# Patient Record
Sex: Male | Born: 1945 | Race: Black or African American | Hispanic: No | Marital: Married | Smoking: Never smoker
Health system: Southern US, Community
[De-identification: ages and names within clinical notes are randomized; demographics above are authoritative.]

## PROBLEM LIST (undated history)

## (undated) DIAGNOSIS — R011 Cardiac murmur, unspecified: Secondary | ICD-10-CM

## (undated) DIAGNOSIS — E785 Hyperlipidemia, unspecified: Secondary | ICD-10-CM

## (undated) DIAGNOSIS — C61 Malignant neoplasm of prostate: Secondary | ICD-10-CM

## (undated) HISTORY — DX: Hyperlipidemia, unspecified: E78.5

## (undated) HISTORY — PX: PROSTATE BIOPSY: SHX241

---

## 1991-05-16 HISTORY — PX: HERNIA REPAIR: SHX51

## 2010-06-30 ENCOUNTER — Emergency Department (INDEPENDENT_AMBULATORY_CARE_PROVIDER_SITE_OTHER): Payer: BC Managed Care – PPO

## 2010-06-30 ENCOUNTER — Emergency Department (HOSPITAL_BASED_OUTPATIENT_CLINIC_OR_DEPARTMENT_OTHER)
Admission: EM | Admit: 2010-06-30 | Discharge: 2010-06-30 | Disposition: A | Discharge status: Home / Self Care | Payer: BC Managed Care – PPO | Attending: Emergency Medicine | Admitting: Emergency Medicine

## 2010-06-30 DIAGNOSIS — R059 Cough, unspecified: Secondary | ICD-10-CM

## 2010-06-30 DIAGNOSIS — E7800 Pure hypercholesterolemia, unspecified: Secondary | ICD-10-CM | POA: Insufficient documentation

## 2010-06-30 DIAGNOSIS — R05 Cough: Secondary | ICD-10-CM | POA: Insufficient documentation

## 2010-06-30 DIAGNOSIS — J40 Bronchitis, not specified as acute or chronic: Secondary | ICD-10-CM | POA: Insufficient documentation

## 2010-06-30 DIAGNOSIS — J45909 Unspecified asthma, uncomplicated: Secondary | ICD-10-CM | POA: Insufficient documentation

## 2010-06-30 DIAGNOSIS — R0989 Other specified symptoms and signs involving the circulatory and respiratory systems: Secondary | ICD-10-CM

## 2010-06-30 LAB — POCT CARDIAC MARKERS
CKMB, poc: 1.2 ng/mL (ref 1.0–8.0)
Myoglobin, poc: 216 ng/mL (ref 12–200)
Myoglobin, poc: 73.4 ng/mL (ref 12–200)
Troponin i, poc: <0.05 ng/mL (ref 0.00–0.09)

## 2011-12-16 ENCOUNTER — Emergency Department: Payer: Self-pay | Admitting: Emergency Medicine

## 2013-01-09 DIAGNOSIS — R079 Chest pain, unspecified: Secondary | ICD-10-CM | POA: Diagnosis not present

## 2013-01-09 DIAGNOSIS — Z23 Encounter for immunization: Secondary | ICD-10-CM | POA: Diagnosis not present

## 2013-01-09 DIAGNOSIS — G4700 Insomnia, unspecified: Secondary | ICD-10-CM | POA: Diagnosis not present

## 2013-01-09 DIAGNOSIS — R69 Illness, unspecified: Secondary | ICD-10-CM | POA: Diagnosis not present

## 2013-01-09 DIAGNOSIS — E785 Hyperlipidemia, unspecified: Secondary | ICD-10-CM | POA: Diagnosis not present

## 2013-01-27 DIAGNOSIS — R079 Chest pain, unspecified: Secondary | ICD-10-CM | POA: Diagnosis not present

## 2013-02-26 DIAGNOSIS — E785 Hyperlipidemia, unspecified: Secondary | ICD-10-CM | POA: Diagnosis not present

## 2013-03-26 DIAGNOSIS — N400 Enlarged prostate without lower urinary tract symptoms: Secondary | ICD-10-CM | POA: Diagnosis not present

## 2013-03-26 DIAGNOSIS — G4700 Insomnia, unspecified: Secondary | ICD-10-CM | POA: Diagnosis not present

## 2013-03-26 DIAGNOSIS — R69 Illness, unspecified: Secondary | ICD-10-CM | POA: Diagnosis not present

## 2013-03-26 DIAGNOSIS — Z0000 Encounter for general adult medical examination without abnormal findings: Secondary | ICD-10-CM | POA: Diagnosis not present

## 2013-03-26 DIAGNOSIS — E785 Hyperlipidemia, unspecified: Secondary | ICD-10-CM | POA: Diagnosis not present

## 2013-03-26 DIAGNOSIS — Z23 Encounter for immunization: Secondary | ICD-10-CM | POA: Diagnosis not present

## 2013-04-08 DIAGNOSIS — Z0000 Encounter for general adult medical examination without abnormal findings: Secondary | ICD-10-CM | POA: Diagnosis not present

## 2013-07-03 DIAGNOSIS — H524 Presbyopia: Secondary | ICD-10-CM | POA: Diagnosis not present

## 2013-08-04 DIAGNOSIS — E7800 Pure hypercholesterolemia, unspecified: Secondary | ICD-10-CM | POA: Diagnosis not present

## 2013-08-04 DIAGNOSIS — N400 Enlarged prostate without lower urinary tract symptoms: Secondary | ICD-10-CM | POA: Diagnosis not present

## 2013-08-04 DIAGNOSIS — M5380 Other specified dorsopathies, site unspecified: Secondary | ICD-10-CM | POA: Diagnosis not present

## 2013-10-02 DIAGNOSIS — N529 Male erectile dysfunction, unspecified: Secondary | ICD-10-CM | POA: Diagnosis not present

## 2013-10-02 DIAGNOSIS — N401 Enlarged prostate with lower urinary tract symptoms: Secondary | ICD-10-CM | POA: Diagnosis not present

## 2013-10-02 DIAGNOSIS — N139 Obstructive and reflux uropathy, unspecified: Secondary | ICD-10-CM | POA: Diagnosis not present

## 2013-10-24 DIAGNOSIS — B351 Tinea unguium: Secondary | ICD-10-CM | POA: Diagnosis not present

## 2013-10-24 DIAGNOSIS — L600 Ingrowing nail: Secondary | ICD-10-CM | POA: Diagnosis not present

## 2013-10-24 DIAGNOSIS — M79609 Pain in unspecified limb: Secondary | ICD-10-CM | POA: Diagnosis not present

## 2014-02-24 DIAGNOSIS — I4901 Ventricular fibrillation: Secondary | ICD-10-CM | POA: Diagnosis not present

## 2014-02-24 DIAGNOSIS — R251 Tremor, unspecified: Secondary | ICD-10-CM | POA: Diagnosis not present

## 2014-02-24 DIAGNOSIS — E785 Hyperlipidemia, unspecified: Secondary | ICD-10-CM | POA: Diagnosis not present

## 2014-02-24 DIAGNOSIS — Z23 Encounter for immunization: Secondary | ICD-10-CM | POA: Diagnosis not present

## 2014-02-24 DIAGNOSIS — R109 Unspecified abdominal pain: Secondary | ICD-10-CM | POA: Diagnosis not present

## 2014-02-24 DIAGNOSIS — N400 Enlarged prostate without lower urinary tract symptoms: Secondary | ICD-10-CM | POA: Diagnosis not present

## 2014-03-23 DIAGNOSIS — Z0000 Encounter for general adult medical examination without abnormal findings: Secondary | ICD-10-CM | POA: Diagnosis not present

## 2014-03-23 DIAGNOSIS — Z23 Encounter for immunization: Secondary | ICD-10-CM | POA: Diagnosis not present

## 2014-03-23 DIAGNOSIS — N400 Enlarged prostate without lower urinary tract symptoms: Secondary | ICD-10-CM | POA: Diagnosis not present

## 2014-03-23 DIAGNOSIS — E785 Hyperlipidemia, unspecified: Secondary | ICD-10-CM | POA: Diagnosis not present

## 2014-10-29 DIAGNOSIS — N5201 Erectile dysfunction due to arterial insufficiency: Secondary | ICD-10-CM | POA: Diagnosis not present

## 2014-10-29 DIAGNOSIS — R399 Unspecified symptoms and signs involving the genitourinary system: Secondary | ICD-10-CM | POA: Diagnosis not present

## 2014-10-29 DIAGNOSIS — N401 Enlarged prostate with lower urinary tract symptoms: Secondary | ICD-10-CM | POA: Diagnosis not present

## 2015-02-16 DIAGNOSIS — H2513 Age-related nuclear cataract, bilateral: Secondary | ICD-10-CM | POA: Diagnosis not present

## 2015-02-16 DIAGNOSIS — H25013 Cortical age-related cataract, bilateral: Secondary | ICD-10-CM | POA: Diagnosis not present

## 2015-02-16 DIAGNOSIS — H40023 Open angle with borderline findings, high risk, bilateral: Secondary | ICD-10-CM | POA: Diagnosis not present

## 2015-02-16 DIAGNOSIS — H524 Presbyopia: Secondary | ICD-10-CM | POA: Diagnosis not present

## 2015-03-20 ENCOUNTER — Emergency Department: Payer: Medicare Other

## 2015-03-20 ENCOUNTER — Encounter: Payer: Self-pay | Admitting: *Deleted

## 2015-03-20 ENCOUNTER — Emergency Department
Admission: EM | Admit: 2015-03-20 | Discharge: 2015-03-20 | Disposition: A | Discharge status: Home / Self Care | Payer: Medicare Other | Attending: Emergency Medicine | Admitting: Emergency Medicine

## 2015-03-20 DIAGNOSIS — R079 Chest pain, unspecified: Secondary | ICD-10-CM | POA: Diagnosis not present

## 2015-03-20 LAB — TROPONIN I

## 2015-03-20 LAB — BASIC METABOLIC PANEL
ANION GAP: 3 — AB (ref 5–15)
BUN: 19 mg/dL (ref 6–20)
CALCIUM: 9.3 mg/dL (ref 8.9–10.3)
CO2: 28 mmol/L (ref 22–32)
Chloride: 107 mmol/L (ref 101–111)
Creatinine, Ser: 1.09 mg/dL (ref 0.61–1.24)
GFR calc Af Amer: >60 mL/min (ref >60)
GFR calc non Af Amer: >60 mL/min (ref >60)
GLUCOSE: 109 mg/dL — AB (ref 65–99)
Potassium: 3.8 mmol/L (ref 3.5–5.1)
Sodium: 138 mmol/L (ref 135–145)

## 2015-03-20 LAB — CBC
HCT: 44.8 % (ref 40.0–52.0)
HEMOGLOBIN: 15 g/dL (ref 13.0–18.0)
MCH: 31.1 pg (ref 26.0–34.0)
MCHC: 33.6 g/dL (ref 32.0–36.0)
MCV: 92.7 fL (ref 80.0–100.0)
Platelets: 232 10*3/uL (ref 150–440)
RBC: 4.84 MIL/uL (ref 4.40–5.90)
RDW: 13 % (ref 11.5–14.5)
WBC: 4.6 10*3/uL (ref 3.8–10.6)

## 2015-03-20 LAB — FIBRIN DERIVATIVES D-DIMER (ARMC ONLY): FIBRIN DERIVATIVES D-DIMER (ARMC): 433 (ref 0–499)

## 2015-03-20 NOTE — Discharge Instructions (Signed)
Nonspecific Chest Pain  °Chest pain can be caused by many different conditions. There is always a chance that your pain could be related to something serious, such as a heart attack or a blood clot in your lungs. Chest pain can also be caused by conditions that are not life-threatening. If you have chest pain, it is very important to follow up with your health care provider. °CAUSES  °Chest pain can be caused by: °· Heartburn. °· Pneumonia or bronchitis. °· Anxiety or stress. °· Inflammation around your heart (pericarditis) or lung (pleuritis or pleurisy). °· A blood clot in your lung. °· A collapsed lung (pneumothorax). It can develop suddenly on its own (spontaneous pneumothorax) or from trauma to the chest. °· Shingles infection (varicella-zoster virus). °· Heart attack. °· Damage to the bones, muscles, and cartilage that make up your chest wall. This can include: °¨ Bruised bones due to injury. °¨ Strained muscles or cartilage due to frequent or repeated coughing or overwork. °¨ Fracture to one or more ribs. °¨ Sore cartilage due to inflammation (costochondritis). °RISK FACTORS  °Risk factors for chest pain may include: °· Activities that increase your risk for trauma or injury to your chest. °· Respiratory infections or conditions that cause frequent coughing. °· Medical conditions or overeating that can cause heartburn. °· Heart disease or family history of heart disease. °· Conditions or health behaviors that increase your risk of developing a blood clot. °· Having had chicken pox (varicella zoster). °SIGNS AND SYMPTOMS °Chest pain can feel like: °· Burning or tingling on the surface of your chest or deep in your chest. °· Crushing, pressure, aching, or squeezing pain. °· Dull or sharp pain that is worse when you move, cough, or take a deep breath. °· Pain that is also felt in your back, neck, shoulder, or arm, or pain that spreads to any of these areas. °Your chest pain may come and go, or it may stay  constant. °DIAGNOSIS °Lab tests or other studies may be needed to find the cause of your pain. Your health care provider may have you take a test called an ambulatory ECG (electrocardiogram). An ECG records your heartbeat patterns at the time the test is performed. You may also have other tests, such as: °· Transthoracic echocardiogram (TTE). During echocardiography, sound waves are used to create a picture of all of the heart structures and to look at how blood flows through your heart. °· Transesophageal echocardiogram (TEE). This is a more advanced imaging test that obtains images from inside your body. It allows your health care provider to see your heart in finer detail. °· Cardiac monitoring. This allows your health care provider to monitor your heart rate and rhythm in real time. °· Holter monitor. This is a portable device that records your heartbeat and can help to diagnose abnormal heartbeats. It allows your health care provider to track your heart activity for several days, if needed. °· Stress tests. These can be done through exercise or by taking medicine that makes your heart beat more quickly. °· Blood tests. °· Imaging tests. °TREATMENT  °Your treatment depends on what is causing your chest pain. Treatment may include: °· Medicines. These may include: °¨ Acid blockers for heartburn. °¨ Anti-inflammatory medicine. °¨ Pain medicine for inflammatory conditions. °¨ Antibiotic medicine, if an infection is present. °¨ Medicines to dissolve blood clots. °¨ Medicines to treat coronary artery disease. °· Supportive care for conditions that do not require medicines. This may include: °¨ Resting. °¨ Applying heat   or cold packs to injured areas. °¨ Limiting activities until pain decreases. °HOME CARE INSTRUCTIONS °· If you were prescribed an antibiotic medicine, finish it all even if you start to feel better. °· Avoid any activities that bring on chest pain. °· Do not use any tobacco products, including  cigarettes, chewing tobacco, or electronic cigarettes. If you need help quitting, ask your health care provider. °· Do not drink alcohol. °· Take medicines only as directed by your health care provider. °· Keep all follow-up visits as directed by your health care provider. This is important. This includes any further testing if your chest pain does not go away. °· If heartburn is the cause for your chest pain, you may be told to keep your head raised (elevated) while sleeping. This reduces the chance that acid will go from your stomach into your esophagus. °· Make lifestyle changes as directed by your health care provider. These may include: °¨ Getting regular exercise. Ask your health care provider to suggest some activities that are safe for you. °¨ Eating a heart-healthy diet. A registered dietitian can help you to learn healthy eating options. °¨ Maintaining a healthy weight. °¨ Managing diabetes, if necessary. °¨ Reducing stress. °SEEK MEDICAL CARE IF: °· Your chest pain does not go away after treatment. °· You have a rash with blisters on your chest. °· You have a fever. °SEEK IMMEDIATE MEDICAL CARE IF:  °· Your chest pain is worse. °· You have an increasing cough, or you cough up blood. °· You have severe abdominal pain. °· You have severe weakness. °· You faint. °· You have chills. °· You have sudden, unexplained chest discomfort. °· You have sudden, unexplained discomfort in your arms, back, neck, or jaw. °· You have shortness of breath at any time. °· You suddenly start to sweat, or your skin gets clammy. °· You feel nauseous or you vomit. °· You suddenly feel light-headed or dizzy. °· Your heart begins to beat quickly, or it feels like it is skipping beats. °These symptoms may represent a serious problem that is an emergency. Do not wait to see if the symptoms will go away. Get medical help right away. Call your local emergency services (911 in the [place] Do not drive yourself to the hospital. °  °This  information is not intended to replace advice given to you by your health care provider. Make sure you discuss any questions you have with your health care provider. °  °Document Released: 02/08/2005 Document Revised: 05/22/2014 Document Reviewed: 12/05/2013 °Elsevier Interactive Patient Education ©2016 [employer] ° °

## 2015-03-20 NOTE — ED Notes (Signed)
Patient c/o left chest pain that radiates to the left side of neck. Patient states pain began this am around 0900. Patient states pain becomes worse when taking deep breaths.

## 2015-03-20 NOTE — ED Notes (Signed)
Pt brought in via triage w/ complaints of new onset chest pain since this morning.  Pt reports weakness in left leg as well. Pt denies any SOB, sweating, dizziness.  Pt A/O x4, no signs of immediate distress at this time.

## 2015-03-20 NOTE — ED Notes (Signed)
Pt verbalized understanding of d/c instructions and verbalized having all belongings at time of d/c. RN searched room after pt left and no belongings were left behind. Pt ambulatory upon d/c

## 2015-03-20 NOTE — ED Provider Notes (Signed)
[facility] Emergency Department Provider Note  ____________________________________________    I have reviewed the triage vital signs and the nursing notes.   HISTORY  Chief Complaint Chest Pain   HPI Ronald Fox is a 69 y.o. male who presents with left anterior dull chest pain that has occasionally radiated to the left side of his neck. He reports the pain initially started at 9 AM this morning he feels the pain is slightly worse with deep breath. He denies injuries to his chest. Denies history of heart disease. He does not smoke cigarettes. No fevers no chills. No shortness of breath or cough. No nausea no vomiting or diaphoresis.     History reviewed. No pertinent past medical history.  There are no active problems to display for this patient.   History reviewed. No pertinent past surgical history.  No current outpatient prescriptions on file.  Allergies Review of patient's allergies indicates no known allergies.  No family history on file.  Social History Social History  Substance Use Topics  . Smoking status: Never Smoker   . Smokeless tobacco: None  . Alcohol Use: No    Review of Systems  Constitutional: Negative for fever. Eyes: Negative for visual changes. ENT: Negative for sore throat Cardiovascular: Positive for chest pain Respiratory: Negative for shortness of breath. Gastrointestinal: Negative for abdominal pain, vomiting and diarrhea. Genitourinary: Negative for dysuria. Musculoskeletal: Negative for back pain. Skin: Negative for rash. Neurological: Negative for headaches or focal weakness Psychiatric no anxiety    ____________________________________________   PHYSICAL EXAM:  VITAL SIGNS: ED Triage Vitals  Enc Vitals Group     BP 03/20/15 1446 126/69 mmHg     Pulse Rate 03/20/15 1446 77     Resp 03/20/15 1446 18     Temp 03/20/15 1446 97.5 F (36.4 C)     Temp Source 03/20/15 1446 Oral     SpO2 03/20/15  1446 96 %     Weight 03/20/15 1446 167 lb (75.751 kg)     Height 03/20/15 1446 5\' 11"  (1.803 m)     Head Cir --      Peak Flow --      Pain Score 03/20/15 1449 6     Pain Loc --      Pain Edu? --      Excl. in [place]? --      Constitutional: Alert and oriented. Well appearing and in no distress. Eyes: Conjunctivae are normal.  ENT   Head: Normocephalic and atraumatic.   Mouth/Throat: Mucous membranes are moist. Cardiovascular: Normal rate, regular rhythm. Normal and symmetric distal pulses are present in all extremities. No murmurs, rubs, or gallops. Respiratory: Normal respiratory effort without tachypnea nor retractions. Breath sounds are clear and equal bilaterally.  Gastrointestinal: Soft and non-tender in all quadrants. No distention. There is no CVA tenderness. Genitourinary: deferred Musculoskeletal: Nontender with normal range of motion in all extremities. No lower extremity tenderness nor edema. Neurologic:  Normal speech and language. No gross focal neurologic deficits are appreciated. Skin:  Skin is warm, dry and intact. No rash noted. Psychiatric: Mood and affect are normal. Patient exhibits appropriate insight and judgment.  ____________________________________________    LABS (pertinent positives/negatives)  Labs Reviewed  BASIC METABOLIC PANEL - Abnormal; Notable for the following:    Glucose, Bld 109 (*)    Anion gap 3 (*)    All other components within normal limits  CBC  TROPONIN I  FIBRIN DERIVATIVES D-DIMER ([facility] ONLY)  TROPONIN I  ____________________________________________   EKG  ED ECG REPORT I, Lavonia Drafts, the attending physician, personally viewed and interpreted this ECG.  Date: 03/20/2015 EKG Time: 2:45 PM Rate: 77 Rhythm: normal sinus rhythm QRS Axis: normal Intervals: normal ST/T Wave abnormalities: normal Conduction Disutrbances: none Narrative Interpretation:  unremarkable   ____________________________________________    RADIOLOGY I have personally reviewed any xrays that were ordered on this patient: Chest x-ray unremarkable, he does have atherosclerosis  ____________________________________________   PROCEDURES  Procedure(s) performed: none  Critical Care performed: none  ____________________________________________   INITIAL IMPRESSION / ASSESSMENT AND PLAN / ED COURSE  Pertinent labs & imaging results that were available during my care of the patient were reviewed by me and considered in my medical decision making (see chart for details).  Patient well-appearing and in no distress. He reports his chest pain has resolved in the emergency department. His initial troponin is negative. He does report mild pleurisy we will check a d-dimer as well as send a second troponin  Second troponin and d-dimer are unremarkable. I discussed with patient the need to follow-up with cardiology within 48 hours for provocative testing. He understands the need to return to the emergency department if pain returns  ____________________________________________   FINAL CLINICAL IMPRESSION(S) / ED DIAGNOSES  Final diagnoses:  Chest pain, unspecified chest pain type     Lavonia Drafts, MD 03/20/15 [phone]

## 2015-04-22 DIAGNOSIS — Z0000 Encounter for general adult medical examination without abnormal findings: Secondary | ICD-10-CM | POA: Diagnosis not present

## 2015-04-22 DIAGNOSIS — Z23 Encounter for immunization: Secondary | ICD-10-CM | POA: Diagnosis not present

## 2015-04-22 DIAGNOSIS — E785 Hyperlipidemia, unspecified: Secondary | ICD-10-CM | POA: Diagnosis not present

## 2015-04-22 DIAGNOSIS — Z79899 Other long term (current) drug therapy: Secondary | ICD-10-CM | POA: Diagnosis not present

## 2015-04-22 DIAGNOSIS — N400 Enlarged prostate without lower urinary tract symptoms: Secondary | ICD-10-CM | POA: Diagnosis not present

## 2015-04-22 DIAGNOSIS — R05 Cough: Secondary | ICD-10-CM | POA: Diagnosis not present

## 2015-04-27 DIAGNOSIS — Z1211 Encounter for screening for malignant neoplasm of colon: Secondary | ICD-10-CM | POA: Diagnosis not present

## 2015-06-14 DIAGNOSIS — N5201 Erectile dysfunction due to arterial insufficiency: Secondary | ICD-10-CM | POA: Diagnosis not present

## 2015-06-14 DIAGNOSIS — N401 Enlarged prostate with lower urinary tract symptoms: Secondary | ICD-10-CM | POA: Diagnosis not present

## 2015-06-14 DIAGNOSIS — R3915 Urgency of urination: Secondary | ICD-10-CM | POA: Diagnosis not present

## 2015-06-14 DIAGNOSIS — Z0000 Encounter for general adult medical examination without abnormal findings: Secondary | ICD-10-CM | POA: Diagnosis not present

## 2015-06-14 DIAGNOSIS — R351 Nocturia: Secondary | ICD-10-CM | POA: Diagnosis not present

## 2015-12-10 DIAGNOSIS — M79675 Pain in left toe(s): Secondary | ICD-10-CM | POA: Diagnosis not present

## 2015-12-10 DIAGNOSIS — M79674 Pain in right toe(s): Secondary | ICD-10-CM | POA: Diagnosis not present

## 2015-12-10 DIAGNOSIS — L600 Ingrowing nail: Secondary | ICD-10-CM | POA: Diagnosis not present

## 2015-12-10 DIAGNOSIS — B353 Tinea pedis: Secondary | ICD-10-CM | POA: Diagnosis not present

## 2015-12-10 DIAGNOSIS — L602 Onychogryphosis: Secondary | ICD-10-CM | POA: Diagnosis not present

## 2016-01-11 DIAGNOSIS — H6123 Impacted cerumen, bilateral: Secondary | ICD-10-CM | POA: Diagnosis not present

## 2016-01-11 DIAGNOSIS — H903 Sensorineural hearing loss, bilateral: Secondary | ICD-10-CM | POA: Diagnosis not present

## 2016-01-28 DIAGNOSIS — L602 Onychogryphosis: Secondary | ICD-10-CM | POA: Diagnosis not present

## 2016-01-28 DIAGNOSIS — L600 Ingrowing nail: Secondary | ICD-10-CM | POA: Diagnosis not present

## 2016-01-28 DIAGNOSIS — B351 Tinea unguium: Secondary | ICD-10-CM | POA: Diagnosis not present

## 2016-01-28 DIAGNOSIS — B353 Tinea pedis: Secondary | ICD-10-CM | POA: Diagnosis not present

## 2016-02-02 DIAGNOSIS — B351 Tinea unguium: Secondary | ICD-10-CM | POA: Diagnosis not present

## 2016-02-24 DIAGNOSIS — H25013 Cortical age-related cataract, bilateral: Secondary | ICD-10-CM | POA: Diagnosis not present

## 2016-02-24 DIAGNOSIS — H2513 Age-related nuclear cataract, bilateral: Secondary | ICD-10-CM | POA: Diagnosis not present

## 2016-02-24 DIAGNOSIS — H40023 Open angle with borderline findings, high risk, bilateral: Secondary | ICD-10-CM | POA: Diagnosis not present

## 2016-02-24 DIAGNOSIS — H35363 Drusen (degenerative) of macula, bilateral: Secondary | ICD-10-CM | POA: Diagnosis not present

## 2016-04-28 DIAGNOSIS — L602 Onychogryphosis: Secondary | ICD-10-CM | POA: Diagnosis not present

## 2016-04-28 DIAGNOSIS — B353 Tinea pedis: Secondary | ICD-10-CM | POA: Diagnosis not present

## 2016-05-01 DIAGNOSIS — Z79899 Other long term (current) drug therapy: Secondary | ICD-10-CM | POA: Diagnosis not present

## 2016-05-01 DIAGNOSIS — E785 Hyperlipidemia, unspecified: Secondary | ICD-10-CM | POA: Diagnosis not present

## 2016-05-01 DIAGNOSIS — Z1159 Encounter for screening for other viral diseases: Secondary | ICD-10-CM | POA: Diagnosis not present

## 2016-05-01 DIAGNOSIS — N400 Enlarged prostate without lower urinary tract symptoms: Secondary | ICD-10-CM | POA: Diagnosis not present

## 2016-05-01 DIAGNOSIS — Z23 Encounter for immunization: Secondary | ICD-10-CM | POA: Diagnosis not present

## 2016-05-01 DIAGNOSIS — Z0000 Encounter for general adult medical examination without abnormal findings: Secondary | ICD-10-CM | POA: Diagnosis not present

## 2016-06-06 DIAGNOSIS — Z1211 Encounter for screening for malignant neoplasm of colon: Secondary | ICD-10-CM | POA: Diagnosis not present

## 2016-06-15 DIAGNOSIS — N5201 Erectile dysfunction due to arterial insufficiency: Secondary | ICD-10-CM | POA: Diagnosis not present

## 2016-06-15 DIAGNOSIS — R3915 Urgency of urination: Secondary | ICD-10-CM | POA: Diagnosis not present

## 2016-06-15 DIAGNOSIS — N401 Enlarged prostate with lower urinary tract symptoms: Secondary | ICD-10-CM | POA: Diagnosis not present

## 2016-07-28 DIAGNOSIS — B353 Tinea pedis: Secondary | ICD-10-CM | POA: Diagnosis not present

## 2016-07-28 DIAGNOSIS — L602 Onychogryphosis: Secondary | ICD-10-CM | POA: Diagnosis not present

## 2016-08-31 DIAGNOSIS — H04123 Dry eye syndrome of bilateral lacrimal glands: Secondary | ICD-10-CM | POA: Diagnosis not present

## 2016-08-31 DIAGNOSIS — H04213 Epiphora due to excess lacrimation, bilateral lacrimal glands: Secondary | ICD-10-CM | POA: Diagnosis not present

## 2016-08-31 DIAGNOSIS — H02833 Dermatochalasis of right eye, unspecified eyelid: Secondary | ICD-10-CM | POA: Diagnosis not present

## 2016-08-31 DIAGNOSIS — H40023 Open angle with borderline findings, high risk, bilateral: Secondary | ICD-10-CM | POA: Diagnosis not present

## 2017-03-01 DIAGNOSIS — H25013 Cortical age-related cataract, bilateral: Secondary | ICD-10-CM | POA: Diagnosis not present

## 2017-03-01 DIAGNOSIS — H35363 Drusen (degenerative) of macula, bilateral: Secondary | ICD-10-CM | POA: Diagnosis not present

## 2017-03-01 DIAGNOSIS — H40023 Open angle with borderline findings, high risk, bilateral: Secondary | ICD-10-CM | POA: Diagnosis not present

## 2017-03-01 DIAGNOSIS — H2513 Age-related nuclear cataract, bilateral: Secondary | ICD-10-CM | POA: Diagnosis not present

## 2017-06-11 DIAGNOSIS — Z6824 Body mass index (BMI) 24.0-24.9, adult: Secondary | ICD-10-CM | POA: Diagnosis not present

## 2017-06-11 DIAGNOSIS — Z841 Family history of disorders of kidney and ureter: Secondary | ICD-10-CM | POA: Diagnosis not present

## 2017-06-11 DIAGNOSIS — E785 Hyperlipidemia, unspecified: Secondary | ICD-10-CM | POA: Diagnosis not present

## 2017-06-11 DIAGNOSIS — L602 Onychogryphosis: Secondary | ICD-10-CM | POA: Diagnosis not present

## 2017-06-11 DIAGNOSIS — Z9889 Other specified postprocedural states: Secondary | ICD-10-CM | POA: Diagnosis not present

## 2017-06-11 DIAGNOSIS — N400 Enlarged prostate without lower urinary tract symptoms: Secondary | ICD-10-CM | POA: Diagnosis not present

## 2017-06-11 DIAGNOSIS — Z7982 Long term (current) use of aspirin: Secondary | ICD-10-CM | POA: Diagnosis not present

## 2017-06-11 DIAGNOSIS — Z808 Family history of malignant neoplasm of other organs or systems: Secondary | ICD-10-CM | POA: Diagnosis not present

## 2017-06-25 ENCOUNTER — Ambulatory Visit
Admission: RE | Admit: 2017-06-25 | Discharge: 2017-06-25 | Disposition: A | Discharge status: Home / Self Care | Payer: Medicare Other | Source: Ambulatory Visit | Attending: Family Medicine | Admitting: Family Medicine

## 2017-06-25 ENCOUNTER — Other Ambulatory Visit: Payer: Self-pay | Admitting: Family Medicine

## 2017-06-25 DIAGNOSIS — N400 Enlarged prostate without lower urinary tract symptoms: Secondary | ICD-10-CM | POA: Diagnosis not present

## 2017-06-25 DIAGNOSIS — E785 Hyperlipidemia, unspecified: Secondary | ICD-10-CM | POA: Diagnosis not present

## 2017-06-25 DIAGNOSIS — Z79899 Other long term (current) drug therapy: Secondary | ICD-10-CM | POA: Diagnosis not present

## 2017-06-25 DIAGNOSIS — M542 Cervicalgia: Secondary | ICD-10-CM | POA: Diagnosis not present

## 2017-06-25 DIAGNOSIS — Z0000 Encounter for general adult medical examination without abnormal findings: Secondary | ICD-10-CM | POA: Diagnosis not present

## 2017-06-25 DIAGNOSIS — Z1211 Encounter for screening for malignant neoplasm of colon: Secondary | ICD-10-CM | POA: Diagnosis not present

## 2017-07-11 DIAGNOSIS — M5412 Radiculopathy, cervical region: Secondary | ICD-10-CM | POA: Diagnosis not present

## 2017-07-17 DIAGNOSIS — H903 Sensorineural hearing loss, bilateral: Secondary | ICD-10-CM | POA: Diagnosis not present

## 2017-07-17 DIAGNOSIS — H6123 Impacted cerumen, bilateral: Secondary | ICD-10-CM | POA: Diagnosis not present

## 2017-07-26 DIAGNOSIS — M542 Cervicalgia: Secondary | ICD-10-CM | POA: Diagnosis not present

## 2017-07-26 DIAGNOSIS — M5412 Radiculopathy, cervical region: Secondary | ICD-10-CM | POA: Diagnosis not present

## 2017-08-16 DIAGNOSIS — M5412 Radiculopathy, cervical region: Secondary | ICD-10-CM | POA: Diagnosis not present

## 2017-09-13 DIAGNOSIS — H40023 Open angle with borderline findings, high risk, bilateral: Secondary | ICD-10-CM | POA: Diagnosis not present

## 2017-09-13 DIAGNOSIS — H04123 Dry eye syndrome of bilateral lacrimal glands: Secondary | ICD-10-CM | POA: Diagnosis not present

## 2017-09-13 DIAGNOSIS — H04213 Epiphora due to excess lacrimation, bilateral lacrimal glands: Secondary | ICD-10-CM | POA: Diagnosis not present

## 2017-09-13 DIAGNOSIS — H02833 Dermatochalasis of right eye, unspecified eyelid: Secondary | ICD-10-CM | POA: Diagnosis not present

## 2017-10-02 DIAGNOSIS — N400 Enlarged prostate without lower urinary tract symptoms: Secondary | ICD-10-CM | POA: Insufficient documentation

## 2017-10-05 DIAGNOSIS — R972 Elevated prostate specific antigen [PSA]: Secondary | ICD-10-CM | POA: Diagnosis not present

## 2017-10-05 DIAGNOSIS — N528 Other male erectile dysfunction: Secondary | ICD-10-CM | POA: Diagnosis not present

## 2017-10-05 DIAGNOSIS — N529 Male erectile dysfunction, unspecified: Secondary | ICD-10-CM | POA: Insufficient documentation

## 2017-10-05 DIAGNOSIS — N401 Enlarged prostate with lower urinary tract symptoms: Secondary | ICD-10-CM | POA: Diagnosis not present

## 2017-10-05 DIAGNOSIS — N138 Other obstructive and reflux uropathy: Secondary | ICD-10-CM | POA: Diagnosis not present

## 2018-02-26 DIAGNOSIS — R69 Illness, unspecified: Secondary | ICD-10-CM | POA: Diagnosis not present

## 2018-02-28 DIAGNOSIS — H35363 Drusen (degenerative) of macula, bilateral: Secondary | ICD-10-CM | POA: Diagnosis not present

## 2018-02-28 DIAGNOSIS — H25013 Cortical age-related cataract, bilateral: Secondary | ICD-10-CM | POA: Diagnosis not present

## 2018-02-28 DIAGNOSIS — H40023 Open angle with borderline findings, high risk, bilateral: Secondary | ICD-10-CM | POA: Diagnosis not present

## 2018-02-28 DIAGNOSIS — H2513 Age-related nuclear cataract, bilateral: Secondary | ICD-10-CM | POA: Diagnosis not present

## 2018-02-28 DIAGNOSIS — H35361 Drusen (degenerative) of macula, right eye: Secondary | ICD-10-CM | POA: Diagnosis not present

## 2018-03-30 IMAGING — DX DG CERVICAL SPINE COMPLETE 4+V
6 series · 6 of 6 positions shown · non-contrast
Comparison: No recent.

CLINICAL DATA: Right-sided neck pain.

EXAM:
CERVICAL SPINE - COMPLETE 4+ VIEW

[dg cervical spine complete (1 of 6)]
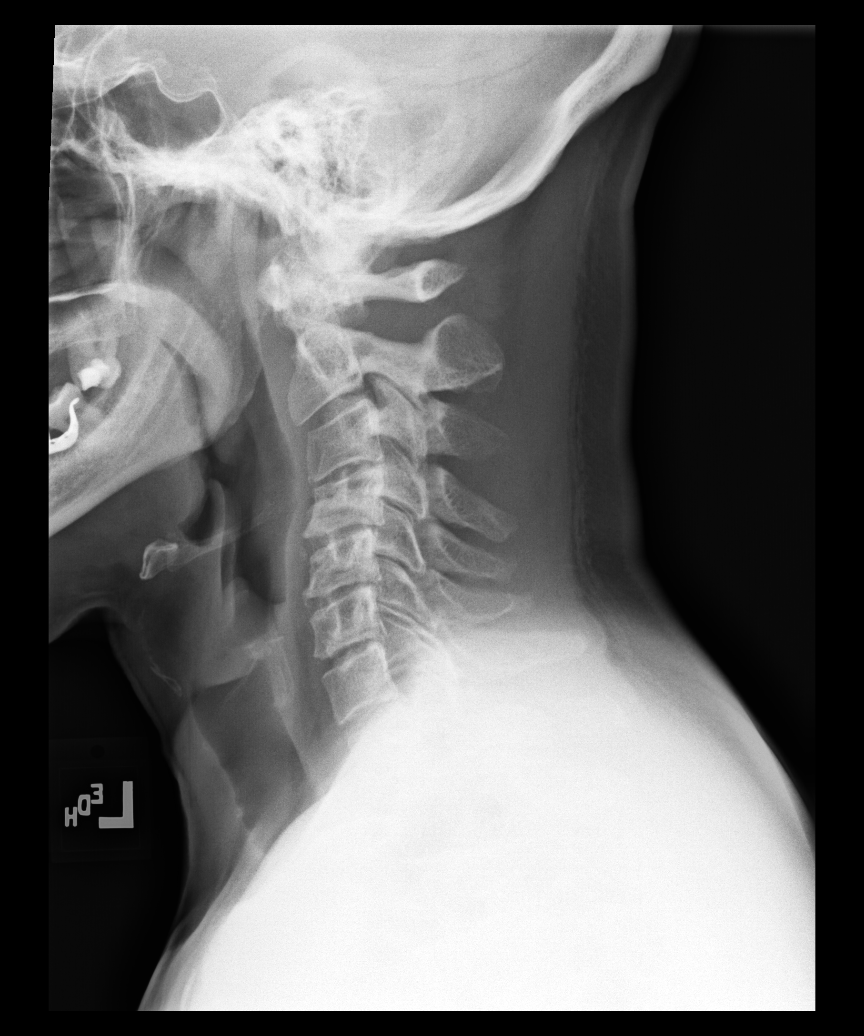

[dg cervical spine complete (2 of 6)]
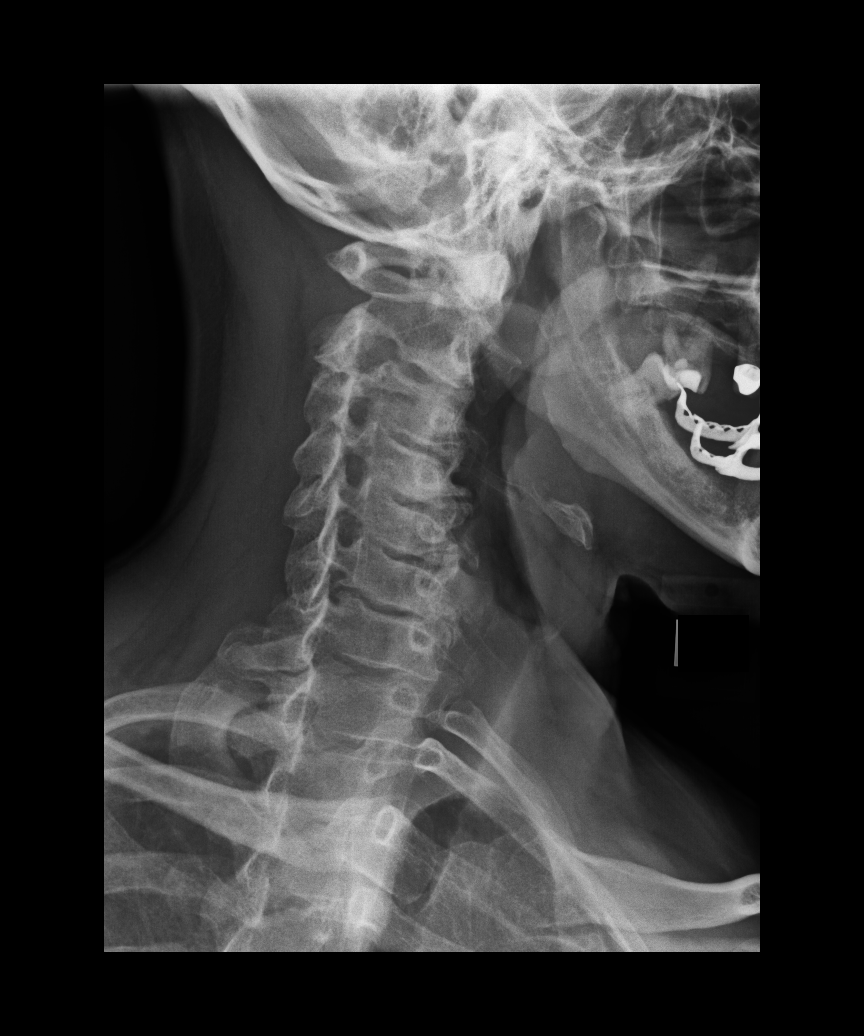

[dg cervical spine complete (3 of 6)]
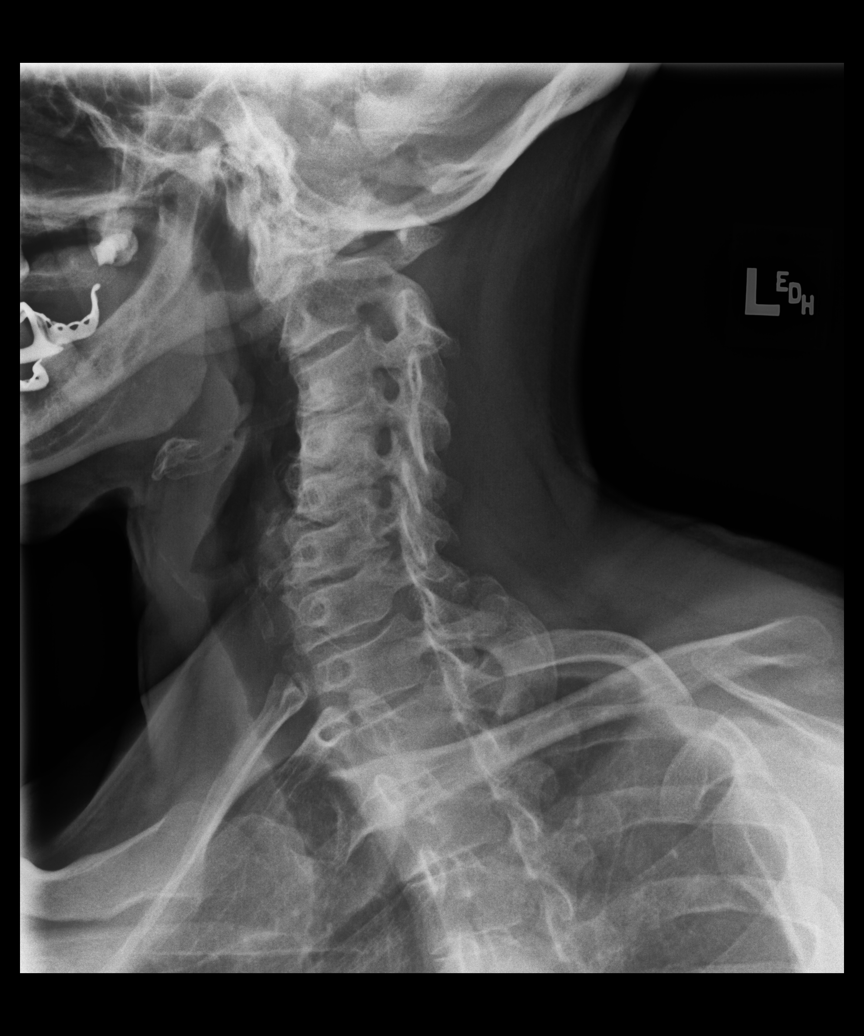

[dg cervical spine complete (4 of 6)]
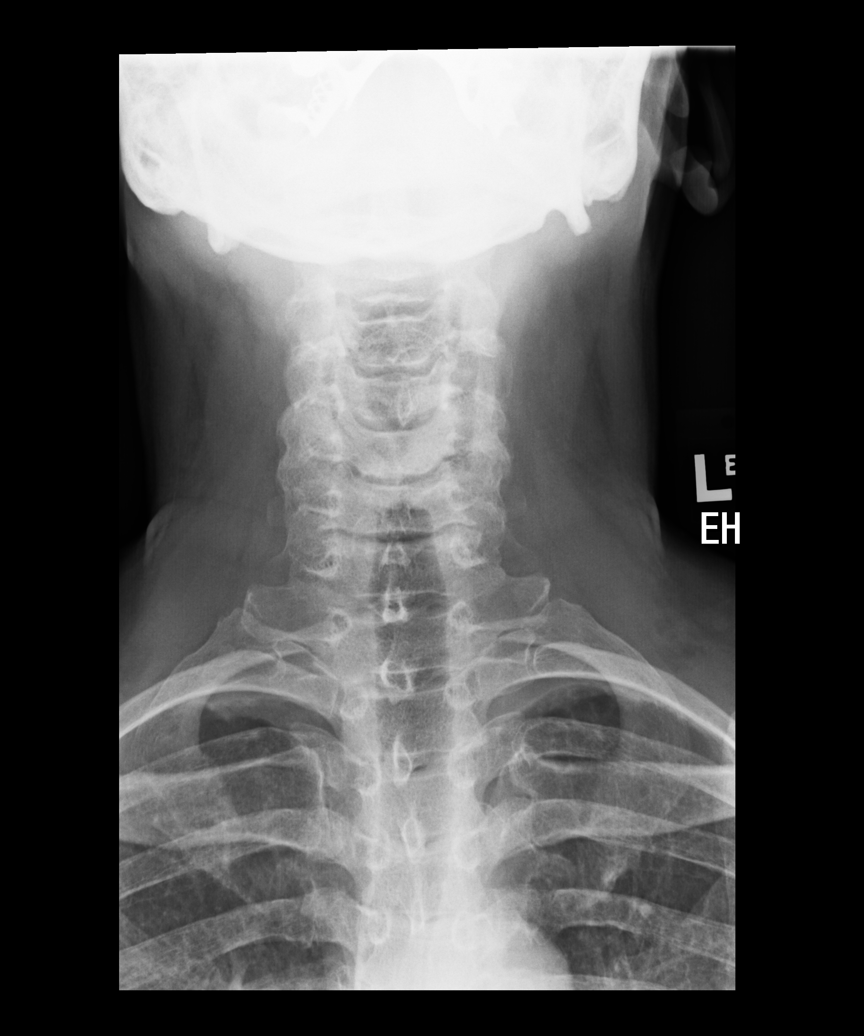

[dg cervical spine complete (5 of 6)]
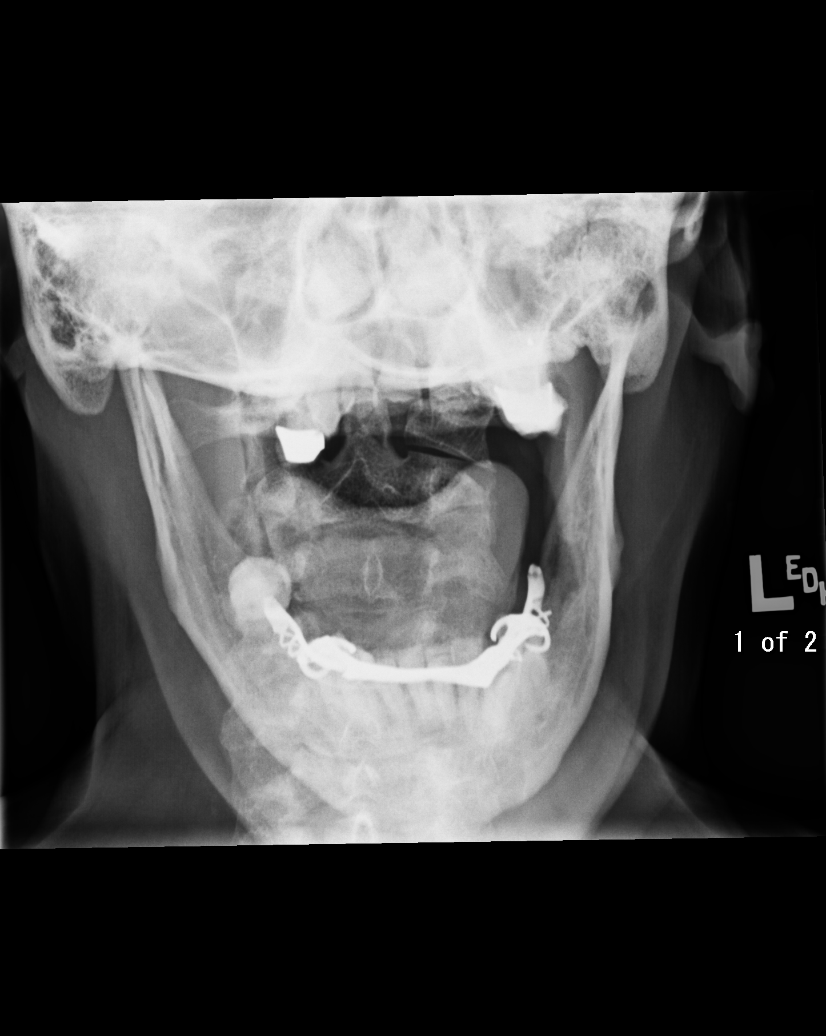

[dg cervical spine complete (6 of 6)]
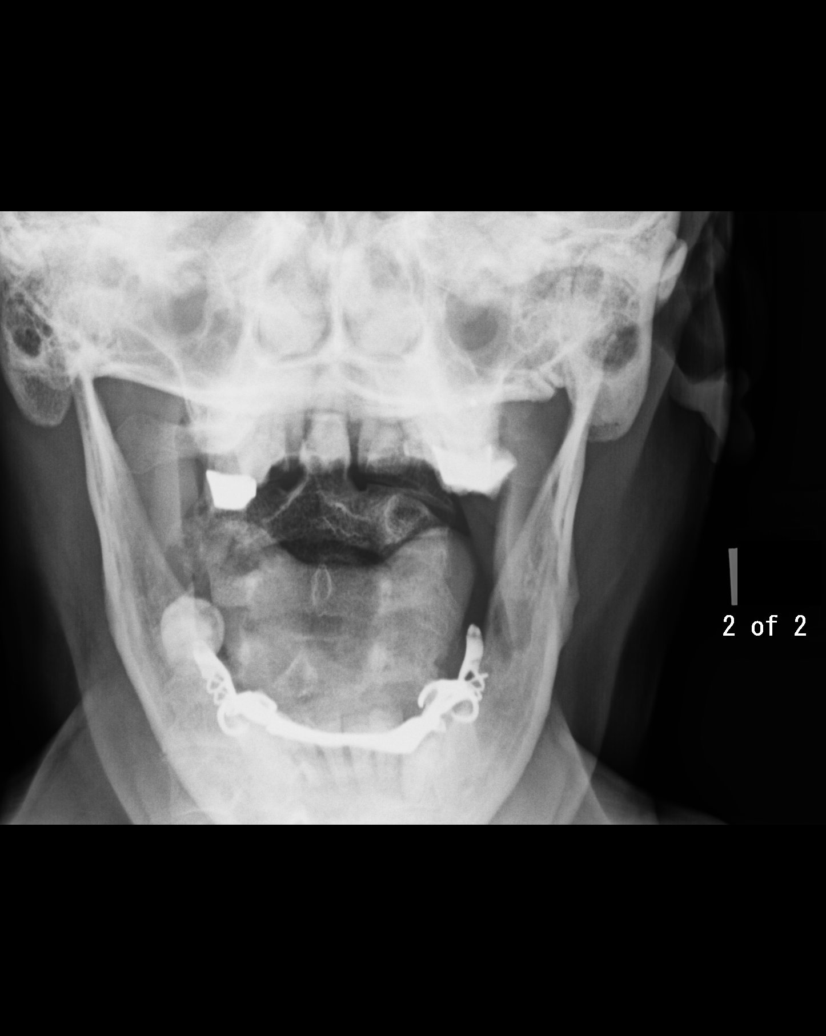

[6 of 6 positions shown; findings below may reference images not displayed]

FINDINGS: Disc space loss with social vertebral endplate osteophyte formation
noted C3-C4, C4-C5, C5-C6, C6-C7. Biapical pleural thickening
consistent scarring. Bilateral multifocal neural foraminal narrowing
noted. No acute bony abnormality identified.
IMPRESSION: Prominent degenerative changes C3 through C7 with disc space loss
and endplate osteophyte formation. Multifocal bilateral neural
foraminal narrowing. No acute bony abnormality.

## 2018-04-05 DIAGNOSIS — N138 Other obstructive and reflux uropathy: Secondary | ICD-10-CM | POA: Diagnosis not present

## 2018-04-05 DIAGNOSIS — N401 Enlarged prostate with lower urinary tract symptoms: Secondary | ICD-10-CM | POA: Diagnosis not present

## 2018-04-05 DIAGNOSIS — N528 Other male erectile dysfunction: Secondary | ICD-10-CM | POA: Diagnosis not present

## 2018-04-05 DIAGNOSIS — R972 Elevated prostate specific antigen [PSA]: Secondary | ICD-10-CM | POA: Diagnosis not present

## 2018-08-05 DIAGNOSIS — Z79899 Other long term (current) drug therapy: Secondary | ICD-10-CM | POA: Diagnosis not present

## 2018-08-05 DIAGNOSIS — N401 Enlarged prostate with lower urinary tract symptoms: Secondary | ICD-10-CM | POA: Diagnosis not present

## 2018-08-05 DIAGNOSIS — Z1211 Encounter for screening for malignant neoplasm of colon: Secondary | ICD-10-CM | POA: Diagnosis not present

## 2018-08-05 DIAGNOSIS — N400 Enlarged prostate without lower urinary tract symptoms: Secondary | ICD-10-CM | POA: Diagnosis not present

## 2018-08-05 DIAGNOSIS — Z125 Encounter for screening for malignant neoplasm of prostate: Secondary | ICD-10-CM | POA: Diagnosis not present

## 2018-08-05 DIAGNOSIS — R0789 Other chest pain: Secondary | ICD-10-CM | POA: Diagnosis not present

## 2018-08-05 DIAGNOSIS — Z0000 Encounter for general adult medical examination without abnormal findings: Secondary | ICD-10-CM | POA: Diagnosis not present

## 2018-08-05 DIAGNOSIS — I700 Atherosclerosis of aorta: Secondary | ICD-10-CM | POA: Diagnosis not present

## 2018-08-05 DIAGNOSIS — E785 Hyperlipidemia, unspecified: Secondary | ICD-10-CM | POA: Diagnosis not present

## 2018-10-04 DIAGNOSIS — R972 Elevated prostate specific antigen [PSA]: Secondary | ICD-10-CM | POA: Diagnosis not present

## 2018-10-04 DIAGNOSIS — N528 Other male erectile dysfunction: Secondary | ICD-10-CM | POA: Diagnosis not present

## 2018-10-04 DIAGNOSIS — N138 Other obstructive and reflux uropathy: Secondary | ICD-10-CM | POA: Diagnosis not present

## 2018-10-04 DIAGNOSIS — N401 Enlarged prostate with lower urinary tract symptoms: Secondary | ICD-10-CM | POA: Diagnosis not present

## 2018-12-05 DIAGNOSIS — E7800 Pure hypercholesterolemia, unspecified: Secondary | ICD-10-CM | POA: Diagnosis not present

## 2019-03-31 DIAGNOSIS — H40033 Anatomical narrow angle, bilateral: Secondary | ICD-10-CM | POA: Diagnosis not present

## 2019-03-31 DIAGNOSIS — H353132 Nonexudative age-related macular degeneration, bilateral, intermediate dry stage: Secondary | ICD-10-CM | POA: Diagnosis not present

## 2019-03-31 DIAGNOSIS — H40023 Open angle with borderline findings, high risk, bilateral: Secondary | ICD-10-CM | POA: Diagnosis not present

## 2019-03-31 DIAGNOSIS — H2513 Age-related nuclear cataract, bilateral: Secondary | ICD-10-CM | POA: Diagnosis not present

## 2019-04-14 DIAGNOSIS — N138 Other obstructive and reflux uropathy: Secondary | ICD-10-CM | POA: Diagnosis not present

## 2019-04-14 DIAGNOSIS — N401 Enlarged prostate with lower urinary tract symptoms: Secondary | ICD-10-CM | POA: Diagnosis not present

## 2019-04-14 DIAGNOSIS — N528 Other male erectile dysfunction: Secondary | ICD-10-CM | POA: Diagnosis not present

## 2019-04-14 DIAGNOSIS — R972 Elevated prostate specific antigen [PSA]: Secondary | ICD-10-CM | POA: Diagnosis not present

## 2020-01-05 ENCOUNTER — Encounter: Payer: Self-pay | Admitting: Radiation Oncology

## 2020-01-05 NOTE — Progress Notes (Signed)
GU Location of Tumor / Histology: prostatic adenocarcinoma  If Prostate Cancer, Gleason Score is (4 + 3) and PSA is (5.45). Prostate volume: 41.39g.   Ronald Fox has known symptomatic BPH and was on finasteride but had to stop it. He has had a borderline elevated PSA.  09/19/2019 PSA  5.57 11/19/2019 PSA  5.45     Past/Anticipated interventions by urology, if any: prostate biopsy, referral for consideration of radiation therapy, bone scan mentioned by Dr. Rosana Hoes but no record of scan being done or scheduled found.  Past/Anticipated interventions by medical oncology, if any: no  Weight changes, if any: no  Bowel/Bladder complaints, if any: IPSS 18. SHIM 19. Reports occasional dysuria. Reports hematuria resolved following biopsy. Reports occasional urinary leakage related to urgency. Denies any bowel complaints.   Nausea/Vomiting, if any: denies  Pain issues, if any:  denies  SAFETY ISSUES:  Prior radiation? denies  Pacemaker/ICD? denies  Possible current pregnancy? no, male patient  Is the patient on methotrexate? denies  Current Complaints / other details:  74 year old male. Married with one son and one daughter. Resides in [place], [place]

## 2020-01-06 ENCOUNTER — Encounter: Payer: Self-pay | Admitting: Radiation Oncology

## 2020-01-06 ENCOUNTER — Ambulatory Visit
Admission: RE | Admit: 2020-01-06 | Discharge: 2020-01-06 | Disposition: A | Discharge status: Home / Self Care | Payer: Medicare HMO | Source: Ambulatory Visit | Attending: Radiation Oncology | Admitting: Radiation Oncology

## 2020-01-06 ENCOUNTER — Encounter: Payer: Self-pay | Admitting: Urology

## 2020-01-06 ENCOUNTER — Other Ambulatory Visit: Payer: Self-pay

## 2020-01-06 ENCOUNTER — Telehealth: Payer: Self-pay | Admitting: Radiation Oncology

## 2020-01-06 VITALS — Ht 71.5 in | Wt 182.0 lb

## 2020-01-06 DIAGNOSIS — C61 Malignant neoplasm of prostate: Secondary | ICD-10-CM

## 2020-01-06 HISTORY — DX: Malignant neoplasm of prostate: C61

## 2020-01-06 NOTE — Progress Notes (Signed)
Radiation Oncology         [phone] ________________________________  Initial Outpatient Consultation - Conducted via Telephone due to current COVID-19 concerns for limiting patient exposure  Name: Ronald Fox MRN: [id number]  Date: 01/06/2020  DOB: 1945-10-30  RJ:JOACZYS, Ivin Booty, MD  Myrlene Broker, MD   REFERRING PHYSICIAN: Myrlene Broker, MD  DIAGNOSIS: 74 y.o. gentleman with Stage T1c adenocarcinoma of the prostate with Gleason score of 4+3, and PSA of 6.45.    ICD-10-CM   1. Prostate cancer [facility])  C61     HISTORY OF PRESENT ILLNESS: Ronald Fox is a 74 y.o. male with a diagnosis of prostate cancer. He has been followed by Dr. Rosana Hoes, and previously Dr. Janice Norrie and Dr. Alyson Ingles, for symptomatic BPH and a borderline elevated PSA since at least 2015. According to Dr. Shann Medal notes, his PSA remained in the 4 range through 2020.  More recently, his PSA climbed to 5.57 in 10/2019 and up to 6.45 in 11/2019.  This prompted a prostate MRI which was performed on 12/01/2019, and showed a 1 cm PI-RADS 4 lesion in the left basilar peripheral zone with likely extraprostatic extension and possible involvement of left neurovascular bundle; possible osseous metastatic disease involving the right sacrum at S1.  The patient proceeded to transrectal ultrasound with 12 biopsies of the prostate on 12/08/2019.  The prostate volume measured 41.39 cc.  Out of 6 regional samples (each containing 2-3 core biopsies), 3 were positive.  The maximum Gleason score was 4+3, and this was seen in the left mid and left base. Additionally, Gleason 3+4 was seen in the right base.  Dr. Rosana Hoes called the patient on 12/11/2019 to discuss the biopsy results. Per his telephone note, a bone scan was recommended, but the patient denies being informed of or called regarding scheduling this.  The patient reviewed the biopsy results with his urologist and he has kindly been referred today for discussion of potential radiation  treatment options.   PREVIOUS RADIATION THERAPY: No  PAST MEDICAL HISTORY:  Past Medical History:  Diagnosis Date  . Prostate cancer ([place])       PAST SURGICAL HISTORY: Past Surgical History:  Procedure Laterality Date  . PROSTATE BIOPSY      FAMILY HISTORY:  Family History  Problem Relation Age of Onset  . Throat cancer Father   . Breast cancer Neg Hx   . Colon cancer Neg Hx   . Pancreatic cancer Neg Hx   . Prostate cancer Neg Hx     SOCIAL HISTORY:  Social History   Socioeconomic History  . Marital status: Married    Spouse name: Not on file  . Number of children: 2  . Years of education: Not on file  . Highest education level: Not on file  Occupational History  . Not on file  Tobacco Use  . Smoking status: Never Smoker  . Smokeless tobacco: Never Used  Vaping Use  . Vaping Use: Never used  Substance and Sexual Activity  . Alcohol use: No  . Drug use: Never  . Sexual activity: Yes  Other Topics Concern  . Not on file  Social History Narrative  . Not on file   Social Determinants of Health   Financial Resource Strain:   . Difficulty of Paying Living Expenses: Not on file  Food Insecurity:   . Worried About Charity fundraiser in the Last Year: Not on file  . Ran Out of Food in the Last  Year: Not on file  Transportation Needs:   . Lack of Transportation (Medical): Not on file  . Lack of Transportation (Non-Medical): Not on file  Physical Activity:   . Days of Exercise per Week: Not on file  . Minutes of Exercise per Session: Not on file  Stress:   . Feeling of Stress : Not on file  Social Connections:   . Frequency of Communication with Friends and Family: Not on file  . Frequency of Social Gatherings with Friends and Family: Not on file  . Attends Religious Services: Not on file  . Active Member of Clubs or Organizations: Not on file  . Attends Archivist Meetings: Not on file  . Marital Status: Not on file  Intimate Partner  Violence:   . Fear of Current or Ex-Partner: Not on file  . Emotionally Abused: Not on file  . Physically Abused: Not on file  . Sexually Abused: Not on file    ALLERGIES: Sulfamethoxazole-trimethoprim and Ciprofloxacin  MEDICATIONS:  Current Outpatient Medications  Medication Sig Dispense Refill  . Ascorbic Acid (VITAMIN C) 1000 MG tablet Take 1,000 mg by mouth daily.    Marland Kitchen aspirin 81 MG EC tablet Take by mouth.    . cholecalciferol (VITAMIN D3) 25 MCG (1000 UNIT) tablet Take 1,000 Units by mouth daily.    . cycloSPORINE (RESTASIS) 0.05 % ophthalmic emulsion 1 drop 2 (two) times daily.    . Multiple Vitamins-Minerals (PRESERVISION AREDS 2+MULTI VIT PO) Take by mouth.    . rosuvastatin (CRESTOR) 10 MG tablet     . tamsulosin (FLOMAX) 0.4 MG CAPS capsule Take 1 capsule by mouth daily.     No current facility-administered medications for this encounter.    REVIEW OF SYSTEMS:  On review of systems, the patient reports that he is doing well overall. He denies any chest pain, shortness of breath, cough, fevers, chills, night sweats, unintended weight changes. He denies any bowel disturbances, and denies abdominal pain, nausea or vomiting. He denies any new musculoskeletal or joint aches or pains. His IPSS was 18, indicating severe urinary symptoms. His SHIM was 16, indicating he has moderate erectile dysfunction. A complete review of systems is obtained and is otherwise negative.    PHYSICAL EXAM:  Wt Readings from Last 3 Encounters:  01/06/20 182 lb (82.6 kg)  03/20/15 167 lb (75.8 kg)   Temp Readings from Last 3 Encounters:  03/20/15 97.5 F (36.4 C) (Oral)   BP Readings from Last 3 Encounters:  03/20/15 120/76   Pulse Readings from Last 3 Encounters:  03/20/15 (!) 55   Pain Assessment Pain Score: 0-No pain/10  Physical exam not performed in light of telephone consult visit format.   KPS = 100  100 - Normal; no complaints; no evidence of disease. 90   - Able to carry on  normal activity; minor signs or symptoms of disease. 80   - Normal activity with effort; some signs or symptoms of disease. 55   - Cares for self; unable to carry on normal activity or to do active work. 60   - Requires occasional assistance, but is able to care for most of his personal needs. 50   - Requires considerable assistance and frequent medical care. 23   - Disabled; requires special care and assistance. 76   - Severely disabled; hospital admission is indicated although death not imminent. 79   - Very sick; hospital admission necessary; active supportive treatment necessary. 10   - Moribund; fatal processes  progressing rapidly. 0     - Dead  Karnofsky DA, Abelmann [place], Craver LS and Burchenal [facility] [phone]) The use of the nitrogen mustards in the palliative treatment of carcinoma: with particular reference to bronchogenic carcinoma Cancer 1 634-56  LABORATORY DATA:  Lab Results  Component Value Date   WBC 4.6 03/20/2015   HGB 15.0 03/20/2015   HCT 44.8 03/20/2015   MCV 92.7 03/20/2015   PLT 232 03/20/2015   Lab Results  Component Value Date   NA 138 03/20/2015   K 3.8 03/20/2015   CL 107 03/20/2015   CO2 28 03/20/2015   No results found for: ALT, AST, GGT, ALKPHOS, BILITOT   RADIOGRAPHY: No results found.    IMPRESSION/PLAN: This visit was conducted via Telephone to spare the patient unnecessary potential exposure in the healthcare setting during the current COVID-19 pandemic. 1. 74 y.o. gentleman with Stage T1c adenocarcinoma of the prostate with Gleason Score of 4+3, and PSA of 6.45. We discussed the patient's workup and outlined the nature of prostate cancer in this setting. The patient's T stage, Gleason's score, and PSA put him into the unfavorable intermediate risk group.  We discussed the importance of proceeding with bone scan to rule out osseous metastatic disease and pending there are no unexpected findings, our treatment recommendation would be to utilize ST-ADT in  combination with 5.5 weeks of external beam radiation, given his potential extracapsular extension. We discussed the available radiation techniques, and focused on the details and logistics of delivery. We discussed and outlined the risks, benefits, short and long-term effects associated with radiotherapy and compared and contrasted these with prostatectomy. We discussed the role of SpaceOAR gel in reducing the rectal toxicity associated with radiotherapy. We also detailed the role of ADT in the treatment of unfavorable intermediate risk prostate cancer and outlined the associated side effects that could be expected with this therapy.  He was encouraged to ask questions that were answered to his stated satisfaction.  At the end of the conversation, the patient is interested in moving forward with the recommended course of 5.5 weeks of prostate IMRT concurrent with ST-ADT, pending there are no unanticipated findings of osseous metastatic disease on bone scan. He has not received his first Lupron/ADT injection. We will share our discussion with Dr. Rosana Hoes and make arrangements for start of ADT, first available as well as help to coordinate the bone scan.  We will also help to coordinate for fiducial marker placement in late October, prior to CT simulation, in anticipation of beginning his daily radiation treatments approximately 2 months after the start of ADT. The patient appears to have a good understanding of his disease and our treatment recommendations which are of curative intent and is in agreement with the stated plan.  We enjoyed meeting him today and look forward to continuing to participate in his care.  Given current concerns for patient exposure during the COVID-19 pandemic, this encounter was conducted via telephone. The patient was notified in advance and was offered a MyChart meeting to allow for face to face communication but unfortunately reported that he did not have the appropriate  resources/technology to support such a visit and instead preferred to proceed with telephone consult. The patient has given verbal consent for this type of encounter. The time spent during this encounter was 60 minutes. The attendants for this meeting include Tyler Pita MD, Ashlyn Bruning PA-C, [place], and patient, Ronald Fox and his wife. During the encounter, Tyler Pita MD,  Ashlyn Bruning PA-C, and scribe, Wilburn Mylar were located at [facility] Radiation Oncology Department.  Pat[place], Rogers Seeds and his wife were located at home.    Nicholos Johns, PA-C    Tyler Pita, MD  [facility] Direct Dial: [phone]  Fax: [phone] [email or web address]  Skype  [email or web address]  This document serves as a record of services personally performed by Tyler Pita, MD and Freeman Caldron, PA-C. It was created on their behalf by Wilburn Mylar, a trained medical scribe. The creation of this record is based on the scribe's personal observations and the provider's statements to them. This document has been checked and approved by the attending provider.

## 2020-01-06 NOTE — Telephone Encounter (Signed)
Opened in error

## 2020-01-09 ENCOUNTER — Telehealth: Payer: Self-pay | Admitting: *Deleted

## 2020-01-09 NOTE — Telephone Encounter (Signed)
RETURNED PATIENT'S WIFE'S PHONE CALL, SPOKE WITH PATIENT'S WIFE ?

## 2020-01-20 ENCOUNTER — Encounter: Payer: Self-pay | Admitting: Medical Oncology

## 2020-01-20 NOTE — Progress Notes (Signed)
I spoke with patient as follow up to radiaiton consult 8/24. I introduced myself as the prostate nurse navigator and discussed my role. I asked if he received Lupron 8/31 and he did. He has not been scheduled for the bone scan. I will follow up with Dr. Rosana Hoes' office regarding appointment and call him back. He voiced understanding.

## 2020-01-20 NOTE — Progress Notes (Signed)
Called Dr. Shann Medal office to see if bone scan has been scheduled. The order has been placed but not scheduled. They asked that patient call so can be scheduled. I will notify patient.

## 2020-01-20 NOTE — Progress Notes (Signed)
Attempted to reach patient as follow up to radiation consult 8/24. There is no answer or answering machine. I will attempt later.

## 2020-01-20 NOTE — Progress Notes (Signed)
Spoke with patient to ask him to call Dr. Shann Medal office to schedule bone scan. He states he received too much Lupron and has been scheduled for an EKG just as a precaution. I asked if he was experiencing any side effects and he asked what they would be. I discussed these with him but he has not had any.  He voiced understanding of the above and will call the office.

## 2020-01-26 ENCOUNTER — Encounter: Payer: Self-pay | Admitting: Medical Oncology

## 2020-01-26 NOTE — Progress Notes (Signed)
Spoke with wife to follow up on bone scan appointment @ [facility]. She states he went on Friday for EKG, but did not know he needed a bone scan. I informed her, I spoke with Dr. Rosana Hoes' office last week to confirm order. They asked patient to call to schedule and I spoke with Mr. Lauf on 9/7 and he voiced understanding. She was not aware but will call to get scheduled. I asked her to call me if she has trouble.

## 2020-02-23 ENCOUNTER — Telehealth: Payer: Self-pay | Admitting: *Deleted

## 2020-02-23 ENCOUNTER — Encounter: Payer: Self-pay | Admitting: Medical Oncology

## 2020-02-23 NOTE — Telephone Encounter (Signed)
Returned patient's wife phone call, spoke with Hope Budds and informed her that Dr. Rosana Hoes' Office will call me tomorrow with a date and time for his fid. markers, Ms. Lamarque verifed understanding this

## 2020-02-23 NOTE — Progress Notes (Signed)
Patient's wife called stating they saw Dr. Rosana Hoes today and will need to start radiation in a few weeks. He will need fiducial markers placed prior to radiation and Enid Derry will help to coordinate this appointment.

## 2020-02-26 ENCOUNTER — Telehealth: Payer: Self-pay | Admitting: *Deleted

## 2020-02-26 NOTE — Telephone Encounter (Signed)
Returned patient's phone call, spoke with patient's wife- Wells Guiles

## 2020-02-27 ENCOUNTER — Telehealth: Payer: Self-pay | Admitting: *Deleted

## 2020-02-27 NOTE — Telephone Encounter (Signed)
CALLED PATIENT'S WIFE TO UPDATE, SPOKE WITH MS. Carolyne Fiscal

## 2020-03-03 ENCOUNTER — Encounter: Payer: Self-pay | Admitting: Urology

## 2020-03-03 NOTE — Progress Notes (Signed)
Patient is scheduled for fid. markers to be placed on Nov. 29 @ Dr. Tresa Endo' Office, and his sim is scheduled for 04-16-20 @ Dr. Johny Shears Office. No SpaceOAR gel so he will need urehral contrast at time of CT SIM/planning. -Kelse Ploch

## 2020-04-15 ENCOUNTER — Telehealth: Payer: Self-pay | Admitting: *Deleted

## 2020-04-15 NOTE — Telephone Encounter (Signed)
CALLED PATIENT TO REMIND OF SIM APPT. FOR 04-16-20 - ARRIVAL TIME- 9:15 AM, LVM FOR A RETURN CALL

## 2020-04-16 ENCOUNTER — Encounter: Payer: Self-pay | Admitting: Medical Oncology

## 2020-04-16 ENCOUNTER — Other Ambulatory Visit: Payer: Self-pay

## 2020-04-16 ENCOUNTER — Ambulatory Visit
Admission: RE | Admit: 2020-04-16 | Discharge: 2020-04-16 | Disposition: A | Discharge status: Home / Self Care | Payer: Medicare HMO | Source: Ambulatory Visit | Attending: Radiation Oncology | Admitting: Radiation Oncology

## 2020-04-16 DIAGNOSIS — Z510 Encounter for antineoplastic radiation therapy: Secondary | ICD-10-CM | POA: Diagnosis not present

## 2020-04-16 DIAGNOSIS — C61 Malignant neoplasm of prostate: Secondary | ICD-10-CM

## 2020-04-16 NOTE — Progress Notes (Addendum)
  Radiation Oncology         [phone] ________________________________  Name: Ronald Fox MRN: [id number]  Date: 04/16/2020  DOB: 1945-12-23  SIMULATION AND TREATMENT PLANNING NOTE    ICD-10-CM   1. Malignant neoplasm of prostate ([place])  C61     DIAGNOSIS:   74 y.o. gentleman with Stage T1c adenocarcinoma of the prostate with Gleason score of 4+3, and PSA of 6.45.  NARRATIVE:  The patient was brought to the [place].  Identity was confirmed.  All relevant records and images related to the planned course of therapy were reviewed.  The patient freely provided informed written consent to proceed with treatment after reviewing the details related to the planned course of therapy. The consent form was witnessed and verified by the simulation staff.  Then, the patient was set-up in a stable reproducible supine position for radiation therapy.  A vacuum lock pillow device was custom fabricated to position his legs in a reproducible immobilized position.  Then, I performed a urethrogram under sterile conditions to identify the prostatic apex.  CT images were obtained.  Surface markings were placed.  The CT images were loaded into the planning software.  Then the prostate target and avoidance structures including the rectum, bladder, bowel and hips were contoured.  Treatment planning then occurred.  The radiation prescription was entered and confirmed.  A total of one complex treatment devices was fabricated. I have requested : Intensity Modulated Radiotherapy (IMRT) is medically necessary for this case for the following reason:  Rectal sparing.Marland Kitchen  PLAN:  The patient will receive 70 Gy in 28 fractions.  ________________________________  Sheral Apley Tammi Klippel, M.D.  This document serves as a record of services personally performed by Tyler Pita, MD. It was created on his behalf by Wilburn Mylar, a trained medical scribe. The creation of this record is based on the scribe's  personal observations and the provider's statements to them. This document has been checked and approved by the attending provider.

## 2020-04-27 DIAGNOSIS — Z510 Encounter for antineoplastic radiation therapy: Secondary | ICD-10-CM | POA: Diagnosis not present

## 2020-04-28 ENCOUNTER — Other Ambulatory Visit: Payer: Self-pay

## 2020-04-28 ENCOUNTER — Ambulatory Visit
Admission: RE | Admit: 2020-04-28 | Discharge: 2020-04-28 | Disposition: A | Discharge status: Home / Self Care | Payer: Medicare HMO | Source: Ambulatory Visit | Attending: Radiation Oncology | Admitting: Radiation Oncology

## 2020-04-28 DIAGNOSIS — C61 Malignant neoplasm of prostate: Secondary | ICD-10-CM

## 2020-04-28 DIAGNOSIS — Z510 Encounter for antineoplastic radiation therapy: Secondary | ICD-10-CM | POA: Diagnosis not present

## 2020-04-29 ENCOUNTER — Ambulatory Visit
Admission: RE | Admit: 2020-04-29 | Discharge: 2020-04-29 | Disposition: A | Discharge status: Home / Self Care | Payer: Medicare HMO | Source: Ambulatory Visit | Attending: Radiation Oncology | Admitting: Radiation Oncology

## 2020-04-29 ENCOUNTER — Encounter: Payer: Self-pay | Admitting: Medical Oncology

## 2020-04-29 ENCOUNTER — Other Ambulatory Visit: Payer: Self-pay

## 2020-04-29 DIAGNOSIS — Z510 Encounter for antineoplastic radiation therapy: Secondary | ICD-10-CM | POA: Diagnosis not present

## 2020-04-30 ENCOUNTER — Ambulatory Visit
Admission: RE | Admit: 2020-04-30 | Discharge: 2020-04-30 | Disposition: A | Discharge status: Home / Self Care | Payer: Medicare HMO | Source: Ambulatory Visit | Attending: Radiation Oncology | Admitting: Radiation Oncology

## 2020-04-30 ENCOUNTER — Other Ambulatory Visit: Payer: Self-pay

## 2020-04-30 DIAGNOSIS — Z510 Encounter for antineoplastic radiation therapy: Secondary | ICD-10-CM | POA: Diagnosis not present

## 2020-05-03 ENCOUNTER — Ambulatory Visit
Admission: RE | Admit: 2020-05-03 | Discharge: 2020-05-03 | Disposition: A | Discharge status: Home / Self Care | Payer: Medicare HMO | Source: Ambulatory Visit | Attending: Radiation Oncology | Admitting: Radiation Oncology

## 2020-05-03 DIAGNOSIS — Z510 Encounter for antineoplastic radiation therapy: Secondary | ICD-10-CM | POA: Diagnosis not present

## 2020-05-04 ENCOUNTER — Ambulatory Visit
Admission: RE | Admit: 2020-05-04 | Discharge: 2020-05-04 | Disposition: A | Discharge status: Home / Self Care | Payer: Medicare HMO | Source: Ambulatory Visit | Attending: Radiation Oncology | Admitting: Radiation Oncology

## 2020-05-04 ENCOUNTER — Ambulatory Visit: Payer: Medicare HMO

## 2020-05-04 DIAGNOSIS — Z510 Encounter for antineoplastic radiation therapy: Secondary | ICD-10-CM | POA: Diagnosis not present

## 2020-05-05 ENCOUNTER — Ambulatory Visit
Admission: RE | Admit: 2020-05-05 | Discharge: 2020-05-05 | Disposition: A | Discharge status: Home / Self Care | Payer: Medicare HMO | Source: Ambulatory Visit | Attending: Radiation Oncology | Admitting: Radiation Oncology

## 2020-05-05 ENCOUNTER — Other Ambulatory Visit: Payer: Self-pay

## 2020-05-05 DIAGNOSIS — Z510 Encounter for antineoplastic radiation therapy: Secondary | ICD-10-CM | POA: Diagnosis not present

## 2020-05-06 ENCOUNTER — Ambulatory Visit
Admission: RE | Admit: 2020-05-06 | Discharge: 2020-05-06 | Disposition: A | Discharge status: Home / Self Care | Payer: Medicare HMO | Source: Ambulatory Visit | Attending: Radiation Oncology | Admitting: Radiation Oncology

## 2020-05-06 DIAGNOSIS — Z510 Encounter for antineoplastic radiation therapy: Secondary | ICD-10-CM | POA: Diagnosis not present

## 2020-05-10 ENCOUNTER — Ambulatory Visit
Admission: RE | Admit: 2020-05-10 | Discharge: 2020-05-10 | Disposition: A | Discharge status: Home / Self Care | Payer: Medicare HMO | Source: Ambulatory Visit | Attending: Radiation Oncology | Admitting: Radiation Oncology

## 2020-05-10 DIAGNOSIS — Z510 Encounter for antineoplastic radiation therapy: Secondary | ICD-10-CM | POA: Diagnosis not present

## 2020-05-11 ENCOUNTER — Ambulatory Visit
Admission: RE | Admit: 2020-05-11 | Discharge: 2020-05-11 | Disposition: A | Discharge status: Home / Self Care | Payer: Medicare HMO | Source: Ambulatory Visit | Attending: Radiation Oncology | Admitting: Radiation Oncology

## 2020-05-11 DIAGNOSIS — Z510 Encounter for antineoplastic radiation therapy: Secondary | ICD-10-CM | POA: Diagnosis not present

## 2020-05-12 ENCOUNTER — Ambulatory Visit
Admission: RE | Admit: 2020-05-12 | Discharge: 2020-05-12 | Disposition: A | Discharge status: Home / Self Care | Payer: Medicare HMO | Source: Ambulatory Visit | Attending: Radiation Oncology | Admitting: Radiation Oncology

## 2020-05-12 DIAGNOSIS — Z510 Encounter for antineoplastic radiation therapy: Secondary | ICD-10-CM | POA: Diagnosis not present

## 2020-05-13 ENCOUNTER — Ambulatory Visit
Admission: RE | Admit: 2020-05-13 | Discharge: 2020-05-13 | Disposition: A | Discharge status: Home / Self Care | Payer: Medicare HMO | Source: Ambulatory Visit | Attending: Radiation Oncology | Admitting: Radiation Oncology

## 2020-05-13 DIAGNOSIS — Z510 Encounter for antineoplastic radiation therapy: Secondary | ICD-10-CM | POA: Diagnosis not present

## 2020-05-17 ENCOUNTER — Encounter: Payer: Self-pay | Admitting: Medical Oncology

## 2020-05-17 ENCOUNTER — Ambulatory Visit
Admission: RE | Admit: 2020-05-17 | Discharge: 2020-05-17 | Disposition: A | Discharge status: Home / Self Care | Payer: Medicare HMO | Source: Ambulatory Visit | Attending: Radiation Oncology | Admitting: Radiation Oncology

## 2020-05-17 ENCOUNTER — Other Ambulatory Visit: Payer: Self-pay

## 2020-05-17 ENCOUNTER — Telehealth: Payer: Self-pay | Admitting: *Deleted

## 2020-05-17 DIAGNOSIS — C61 Malignant neoplasm of prostate: Secondary | ICD-10-CM | POA: Diagnosis not present

## 2020-05-17 DIAGNOSIS — Z510 Encounter for antineoplastic radiation therapy: Secondary | ICD-10-CM | POA: Diagnosis present

## 2020-05-17 NOTE — Telephone Encounter (Signed)
RETURNED PATIENT'S PHONE CALL, UNABLE TO LEAVE MESSAGE 

## 2020-05-18 ENCOUNTER — Ambulatory Visit
Admission: RE | Admit: 2020-05-18 | Discharge: 2020-05-18 | Disposition: A | Discharge status: Home / Self Care | Payer: Medicare HMO | Source: Ambulatory Visit | Attending: Radiation Oncology | Admitting: Radiation Oncology

## 2020-05-18 DIAGNOSIS — Z510 Encounter for antineoplastic radiation therapy: Secondary | ICD-10-CM | POA: Diagnosis not present

## 2020-05-19 ENCOUNTER — Ambulatory Visit
Admission: RE | Admit: 2020-05-19 | Discharge: 2020-05-19 | Disposition: A | Discharge status: Home / Self Care | Payer: Medicare HMO | Source: Ambulatory Visit | Attending: Radiation Oncology | Admitting: Radiation Oncology

## 2020-05-19 DIAGNOSIS — Z510 Encounter for antineoplastic radiation therapy: Secondary | ICD-10-CM | POA: Diagnosis not present

## 2020-05-20 ENCOUNTER — Other Ambulatory Visit: Payer: Self-pay

## 2020-05-20 ENCOUNTER — Ambulatory Visit
Admission: RE | Admit: 2020-05-20 | Discharge: 2020-05-20 | Disposition: A | Discharge status: Home / Self Care | Payer: Medicare HMO | Source: Ambulatory Visit | Attending: Radiation Oncology | Admitting: Radiation Oncology

## 2020-05-20 DIAGNOSIS — Z510 Encounter for antineoplastic radiation therapy: Secondary | ICD-10-CM | POA: Diagnosis not present

## 2020-05-21 ENCOUNTER — Ambulatory Visit
Admission: RE | Admit: 2020-05-21 | Discharge: 2020-05-21 | Disposition: A | Discharge status: Home / Self Care | Payer: Medicare HMO | Source: Ambulatory Visit | Attending: Radiation Oncology | Admitting: Radiation Oncology

## 2020-05-21 ENCOUNTER — Other Ambulatory Visit: Payer: Self-pay

## 2020-05-21 DIAGNOSIS — Z510 Encounter for antineoplastic radiation therapy: Secondary | ICD-10-CM | POA: Diagnosis not present

## 2020-05-24 ENCOUNTER — Ambulatory Visit
Admission: RE | Admit: 2020-05-24 | Discharge: 2020-05-24 | Disposition: A | Discharge status: Home / Self Care | Payer: Medicare HMO | Source: Ambulatory Visit | Attending: Radiation Oncology | Admitting: Radiation Oncology

## 2020-05-24 ENCOUNTER — Other Ambulatory Visit: Payer: Self-pay

## 2020-05-24 DIAGNOSIS — Z510 Encounter for antineoplastic radiation therapy: Secondary | ICD-10-CM | POA: Diagnosis not present

## 2020-05-25 ENCOUNTER — Ambulatory Visit
Admission: RE | Admit: 2020-05-25 | Discharge: 2020-05-25 | Disposition: A | Discharge status: Home / Self Care | Payer: Medicare HMO | Source: Ambulatory Visit | Attending: Radiation Oncology | Admitting: Radiation Oncology

## 2020-05-25 DIAGNOSIS — Z510 Encounter for antineoplastic radiation therapy: Secondary | ICD-10-CM | POA: Diagnosis not present

## 2020-05-26 ENCOUNTER — Other Ambulatory Visit: Payer: Self-pay

## 2020-05-26 ENCOUNTER — Ambulatory Visit
Admission: RE | Admit: 2020-05-26 | Discharge: 2020-05-26 | Disposition: A | Discharge status: Home / Self Care | Payer: Medicare HMO | Source: Ambulatory Visit | Attending: Radiation Oncology | Admitting: Radiation Oncology

## 2020-05-26 DIAGNOSIS — Z510 Encounter for antineoplastic radiation therapy: Secondary | ICD-10-CM | POA: Diagnosis not present

## 2020-05-27 ENCOUNTER — Ambulatory Visit
Admission: RE | Admit: 2020-05-27 | Discharge: 2020-05-27 | Disposition: A | Discharge status: Home / Self Care | Payer: Medicare HMO | Source: Ambulatory Visit | Attending: Radiation Oncology | Admitting: Radiation Oncology

## 2020-05-27 DIAGNOSIS — Z510 Encounter for antineoplastic radiation therapy: Secondary | ICD-10-CM | POA: Diagnosis not present

## 2020-05-28 ENCOUNTER — Ambulatory Visit
Admission: RE | Admit: 2020-05-28 | Discharge: 2020-05-28 | Disposition: A | Discharge status: Home / Self Care | Payer: Medicare HMO | Source: Ambulatory Visit | Attending: Radiation Oncology | Admitting: Radiation Oncology

## 2020-05-28 DIAGNOSIS — Z510 Encounter for antineoplastic radiation therapy: Secondary | ICD-10-CM | POA: Diagnosis not present

## 2020-05-31 ENCOUNTER — Ambulatory Visit: Payer: Medicare HMO

## 2020-06-01 ENCOUNTER — Ambulatory Visit: Payer: Medicare HMO

## 2020-06-02 ENCOUNTER — Ambulatory Visit
Admission: RE | Admit: 2020-06-02 | Discharge: 2020-06-02 | Disposition: A | Discharge status: Home / Self Care | Payer: Medicare HMO | Source: Ambulatory Visit | Attending: Radiation Oncology | Admitting: Radiation Oncology

## 2020-06-02 ENCOUNTER — Other Ambulatory Visit: Payer: Self-pay

## 2020-06-02 DIAGNOSIS — Z510 Encounter for antineoplastic radiation therapy: Secondary | ICD-10-CM | POA: Diagnosis not present

## 2020-06-03 ENCOUNTER — Other Ambulatory Visit: Payer: Self-pay

## 2020-06-03 ENCOUNTER — Ambulatory Visit
Admission: RE | Admit: 2020-06-03 | Discharge: 2020-06-03 | Disposition: A | Discharge status: Home / Self Care | Payer: Medicare HMO | Source: Ambulatory Visit | Attending: Radiation Oncology | Admitting: Radiation Oncology

## 2020-06-03 DIAGNOSIS — Z510 Encounter for antineoplastic radiation therapy: Secondary | ICD-10-CM | POA: Diagnosis not present

## 2020-06-04 ENCOUNTER — Ambulatory Visit
Admission: RE | Admit: 2020-06-04 | Discharge: 2020-06-04 | Disposition: A | Discharge status: Home / Self Care | Payer: Medicare HMO | Source: Ambulatory Visit | Attending: Radiation Oncology | Admitting: Radiation Oncology

## 2020-06-04 ENCOUNTER — Other Ambulatory Visit: Payer: Self-pay

## 2020-06-04 DIAGNOSIS — Z510 Encounter for antineoplastic radiation therapy: Secondary | ICD-10-CM | POA: Diagnosis not present

## 2020-06-07 ENCOUNTER — Other Ambulatory Visit: Payer: Self-pay

## 2020-06-07 ENCOUNTER — Ambulatory Visit
Admission: RE | Admit: 2020-06-07 | Discharge: 2020-06-07 | Disposition: A | Discharge status: Home / Self Care | Payer: Medicare HMO | Source: Ambulatory Visit | Attending: Radiation Oncology | Admitting: Radiation Oncology

## 2020-06-07 DIAGNOSIS — Z510 Encounter for antineoplastic radiation therapy: Secondary | ICD-10-CM | POA: Diagnosis not present

## 2020-06-08 ENCOUNTER — Ambulatory Visit: Payer: Medicare HMO

## 2020-06-08 ENCOUNTER — Ambulatory Visit
Admission: RE | Admit: 2020-06-08 | Discharge: 2020-06-08 | Disposition: A | Discharge status: Home / Self Care | Payer: Medicare HMO | Source: Ambulatory Visit | Attending: Radiation Oncology | Admitting: Radiation Oncology

## 2020-06-08 DIAGNOSIS — Z510 Encounter for antineoplastic radiation therapy: Secondary | ICD-10-CM | POA: Diagnosis not present

## 2020-06-09 ENCOUNTER — Ambulatory Visit
Admission: RE | Admit: 2020-06-09 | Discharge: 2020-06-09 | Disposition: A | Discharge status: Home / Self Care | Payer: Medicare HMO | Source: Ambulatory Visit | Attending: Radiation Oncology | Admitting: Radiation Oncology

## 2020-06-09 ENCOUNTER — Ambulatory Visit: Payer: Medicare HMO

## 2020-06-09 DIAGNOSIS — Z510 Encounter for antineoplastic radiation therapy: Secondary | ICD-10-CM | POA: Diagnosis not present

## 2020-06-10 ENCOUNTER — Ambulatory Visit
Admission: RE | Admit: 2020-06-10 | Discharge: 2020-06-10 | Disposition: A | Discharge status: Home / Self Care | Payer: Medicare HMO | Source: Ambulatory Visit | Attending: Radiation Oncology | Admitting: Radiation Oncology

## 2020-06-10 ENCOUNTER — Encounter: Payer: Self-pay | Admitting: Medical Oncology

## 2020-06-10 ENCOUNTER — Encounter: Payer: Self-pay | Admitting: Radiation Oncology

## 2020-06-10 ENCOUNTER — Other Ambulatory Visit: Payer: Self-pay

## 2020-06-10 DIAGNOSIS — Z510 Encounter for antineoplastic radiation therapy: Secondary | ICD-10-CM | POA: Diagnosis not present

## 2020-06-14 NOTE — Progress Notes (Signed)
  Radiation Oncology         [phone] ________________________________  Name: Ronald Fox MRN: [id number]  Date: 06/10/2020  DOB: [birth year]  End of Treatment Note  Diagnosis:   75 y.o. gentleman with Stage T1c adenocarcinoma of the prostate with Gleason score of 4+3, and PSA of 6.45.     Indication for treatment:  Curative, Definitive Radiotherapy       Radiation treatment dates:   04/28/20-06/10/20  Site/dose:   The prostate was treated to 70 Gy in 28 fractions of 2.5 Gy  Beams/energy:   The patient was treated with IMRT using volumetric arc therapy delivering 6 MV X-rays to clockwise and counterclockwise circumferential arcs with a 90 degree collimator offset to avoid dose scalloping.  Image guidance was performed with daily cone beam CT prior to each fraction to align to gold markers in the prostate and assure proper bladder and rectal fill volumes.  Immobilization was achieved with BodyFix custom mold.  Narrative: The patient tolerated radiation treatment relatively well.   The patient experienced some minor urinary irritation and modest fatigue.    Plan: The patient has completed radiation treatment. He will return to radiation oncology clinic for routine followup in one month. I advised him to call or return sooner if he has any questions or concerns related to his recovery or treatment. ________________________________  Sheral Apley. Tammi Klippel, M.D.

## 2020-07-05 ENCOUNTER — Telehealth: Payer: Self-pay

## 2020-07-05 NOTE — Telephone Encounter (Signed)
Left patient voicemail message in regards to telephone visit with Ashlyn Brunning PA on 07/14/20 @ 10:30am. Called to review medications, AUA and prostate questions. TM

## 2020-07-12 ENCOUNTER — Telehealth: Payer: Self-pay

## 2020-07-12 NOTE — Telephone Encounter (Signed)
Left voicemail message in regards to telephone visit on 07/14/20 @ 10:30am. Advised on message this is a phone visit not in person. Called to review meaningful use AUA and prostates questions. TM

## 2020-07-14 ENCOUNTER — Other Ambulatory Visit: Payer: Self-pay | Admitting: Urology

## 2020-07-14 ENCOUNTER — Ambulatory Visit
Admission: RE | Admit: 2020-07-14 | Discharge: 2020-07-14 | Disposition: A | Discharge status: Home / Self Care | Payer: Medicare HMO | Source: Ambulatory Visit | Attending: Urology | Admitting: Urology

## 2020-07-14 ENCOUNTER — Telehealth: Payer: Self-pay

## 2020-07-14 ENCOUNTER — Encounter: Payer: Self-pay | Admitting: Urology

## 2020-07-14 ENCOUNTER — Other Ambulatory Visit: Payer: Self-pay

## 2020-07-14 DIAGNOSIS — C61 Malignant neoplasm of prostate: Secondary | ICD-10-CM

## 2020-07-14 MED ORDER — TAMSULOSIN HCL 0.4 MG PO CAPS: 0.8000 mg | ORAL_CAPSULE | Freq: Every day | ORAL | 0 refills | Status: DC

## 2020-07-14 NOTE — Telephone Encounter (Signed)
Spoke with patient in regards to telephone visit appointment with Freeman Caldron PA on 07/14/20 @ 10:30am. Patient verbalized understanding of appointment date and time. Reviewed meaningful use, AUA and prostate questions. TM

## 2020-07-14 NOTE — Progress Notes (Signed)
Patient has telephone visit today with Ashlyn Bruning PA. Patient states nocturia 4-5 times per night. Patient states occasional dysuria. Patient states hematuria 1 week ago that has since resolved. Patient states that it was a small amount of blood and occurred 2 times. Patient states occasional constipation. Patient states urine stream is moderate and intermittent. Patient states that he is not emptying his bladder completely and returns to the bathroom 45 minutes later. Patient states urgency and has a difficult time holding his urine. Patient denies pushing or straining. Patient denies fatigue. Patient states that he is currently taking Tamsulosin as directed. Patient states that he does not have a follow-up appointment with [facility].

## 2020-07-14 NOTE — Progress Notes (Signed)
Radiation Oncology         [phone] ________________________________  Name: Ronald Fox MRN: [id number]  Date: 07/14/2020  DOB: [birth year]  Post Treatment Note  CC: Jonathon Jordan, MD  Myrlene Broker, MD  Diagnosis:   75 y.o. gentleman with Stage T1c adenocarcinoma of the prostate with Gleason score of 4+3, and PSA of 6.45.  Interval Since Last Radiation:  5 weeks  04/28/20-06/10/20:   The prostate was treated to 70 Gy in 28 fractions of 2.5 Gy  Narrative:  The patient returns today for routine follow-up.  He tolerated radiation treatment relatively well with only minor urinary irritation and modest fatigue.  He did report rare dysuria, bowel and bladder urgency and frequency, nocturia x10, intermittent urine stream and incomplete emptying despite taking Flomax as prescribed.                              On review of systems, the patient states that he is doing very well in general.  He reports gradual improvement in his LUTS, most notably, nocturia is reduced to 4-5 times per night and he has having very rare dysuria at the start of his stream and decreased daytime frequency/urgency.  He continues with a weak flow of stream and intermittency despite continuing the Flomax nightly as prescribed.  He denies any significant fatigue and reports a healthy appetite.  His bowels are improved and at this point, he is having occasional constipation but denies abdominal pain, nausea, vomiting or diarrhea.  He uses a stool softener as needed to manage the constipation.  Overall, he is pleased with his progress to date.  ALLERGIES:  is allergic to sulfamethoxazole-trimethoprim and ciprofloxacin.  Meds: Current Outpatient Medications  Medication Sig Dispense Refill  . Ascorbic Acid (VITAMIN C) 1000 MG tablet Take 1,000 mg by mouth daily.    Marland Kitchen aspirin 81 MG EC tablet Take by mouth.    . cholecalciferol (VITAMIN D3) 25 MCG (1000 UNIT) tablet Take 1,000 Units by mouth daily.    . cycloSPORINE  (RESTASIS) 0.05 % ophthalmic emulsion 1 drop 2 (two) times daily.    . rosuvastatin (CRESTOR) 10 MG tablet     . tamsulosin (FLOMAX) 0.4 MG CAPS capsule Take 1 capsule by mouth daily.    . Multiple Vitamins-Minerals (PRESERVISION AREDS 2+MULTI VIT PO) Take by mouth. (Patient not taking: Reported on 07/14/2020)     No current facility-administered medications for this encounter.    Physical Findings:  vitals were not taken for this visit.   /Unable to assess due to telephone follow-up visit format.  Lab Findings: Lab Results  Component Value Date   WBC 4.6 03/20/2015   HGB 15.0 03/20/2015   HCT 44.8 03/20/2015   MCV 92.7 03/20/2015   PLT 232 03/20/2015     Radiographic Findings: No results found.  Impression/Plan: 1. 75 y.o. gentleman with Stage T1c adenocarcinoma of the prostate with Gleason score of 4+3, and PSA of 6.45. He will continue to follow up with urology for ongoing PSA determinations but does not currently have an appointment scheduled with Dr. Rosana Hoes to his knowledge. He understands what to expect with regards to PSA monitoring going forward I would anticipate his first posttreatment PSA to be checked sometime in April, approximately 3 months from completion of treatment.  We discussed doubling the dose of Flomax nightly to see if this will further improve the nocturia, intermittency and weak flow of stream.  I will send a prescription with the increased dosage to his mail order pharmacy, to get him through until his follow-up visit with Dr. Rosana Hoes.  I will look forward to following his response to treatment via correspondence with urology, and would be happy to continue to participate in his care if clinically indicated. I talked to the patient about what to expect in the future, including his risk for erectile dysfunction and rectal bleeding. I encouraged him to call or return to the office if he has any questions regarding his previous radiation or possible radiation side  effects. He was comfortable with this plan and will follow up as needed.   Nicholos Johns, PA-C

## 2020-07-16 ENCOUNTER — Telehealth: Payer: Self-pay | Admitting: Radiation Oncology

## 2020-07-16 NOTE — Telephone Encounter (Signed)
Phoned [facility] to confirm receipt of faxed flomax script. Understand no script was received. Spoke with pharmacist, Jeneen Rinks. Provided verbal order for flomax 0.4 mg two tablets by mouth at supper, qty 180, and no refills per [employer], PA-C.

## 2020-07-16 NOTE — Telephone Encounter (Signed)
-----   Message from Freeman Caldron, [place] sent at 07/14/2020  2:02 PM EST ----- Regarding: RE: Mail order Rx for [facility] I sent the Rx via fax. Please follow up with pharmacy to ensure they received it. Thank you!!! Lia Foyer ----- Message ----- From: Heywood Footman, RN Sent: 07/14/2020   1:50 PM EST To: Freeman Caldron, PA-C Subject: RE: Mail order Rx for [facility]                 It [facility] phone is [phone] fax is [phone]. They partnered with [facility]. I can't find anything at [facility]. The pharmacist at [facility] encouraged we fax it.   Sam ----- Message ----- From: Freeman Caldron, PA-C Sent: 07/14/2020  11:04 AM EST To: Heywood Footman, RN Subject: Mail order Rx for Ronald Fox, I just spoke with Ronald Fox to conduct his 1 month f/u visit and we discussed increasing his Flomax to 2 capsules po qhs. He will need a new Rx sent to his mail order pharmacy so that he does not run out of medications before his follow up with Urology.  I thought that I could send the mail order Rx via EMR but I cannot find the one he uses- he told me it is [facility], [place], [place]... Will you please reach out to him to get the appropriate information so that we can get this taken care of? It may well be that this has to be completed on a paper form and faxed over. Thank you!!!! -Lia Foyer

## 2020-09-24 DIAGNOSIS — E7800 Pure hypercholesterolemia, unspecified: Secondary | ICD-10-CM | POA: Insufficient documentation

## 2021-12-06 ENCOUNTER — Ambulatory Visit: Payer: Self-pay | Admitting: General Surgery

## 2021-12-06 NOTE — H&P (Signed)
PATIENT PROFILE: Ronald Fox is a 76 y.o. male who presents to the Clinic for consultation at the request of Eulas Post, [place] for evaluation of left groin hernia.  PCP:  Barnabas Lister, MD  HISTORY OF PRESENT ILLNESS: [name] reports having a hernia in the left groin since about a year ago.  He has been feeling the swelling on the left groin.  There is a bulge.  He cannot reduce the bulge.  He feels some pain and discomfort on the left groin.  It is exacerbated by activities.  Alleviating factor resting and reducing the hernia.  Denies any episode of abdominal incision nausea or migraine.  Patient has history of right inguinal hernia repair more than 10 years ago.  Endorses some discomfort in the right groin.   PROBLEM LIST: Problem List  Date Reviewed: 10/14/2021          Noted   Prostate cancer (CMS-HCC) 09/24/2020   Hypercholesterolemia 09/24/2020    GENERAL REVIEW OF SYSTEMS:   General ROS: negative for - chills, fatigue, fever, weight gain or weight loss Allergy and Immunology ROS: negative for - hives  Hematological and Lymphatic ROS: negative for - bleeding problems or bruising, negative for palpable nodes Endocrine ROS: negative for - heat or cold intolerance, hair changes Respiratory ROS: negative for - cough, shortness of breath or wheezing Cardiovascular ROS: no chest pain or palpitations GI ROS: negative for nausea, vomiting, abdominal pain, diarrhea, constipation Musculoskeletal ROS: negative for - joint swelling or muscle pain Neurological ROS: negative for - confusion, syncope Dermatological ROS: negative for pruritus and rash Psychiatric: negative for anxiety, depression, difficulty sleeping and memory loss  MEDICATIONS: Current Outpatient Medications  Medication Sig Dispense Refill   ascorbic acid, vitamin C, (VITAMIN C) 1000 MG tablet Take 1,000 mg by mouth once daily     aspirin 81 MG EC tablet Take 81 mg by mouth once daily.     cholecalciferol (VITAMIN D3)  1000 unit capsule Take 1,000 Units by mouth once daily     RESTASIS 0.05 % ophthalmic emulsion      rosuvastatin (CRESTOR) 10 MG tablet Take 1 tablet (10 mg total) by mouth 3 (three) times a week 90 tablet 3   tamsulosin (FLOMAX) 0.4 mg capsule Take 0.4 mg by mouth once daily Take 30 minutes after same meal each day.     No current facility-administered medications for this visit.    ALLERGIES: Sulfamethoxazole-trimethoprim, Atorvastatin, and Ciprofloxacin  PAST MEDICAL HISTORY: Past Medical History:  Diagnosis Date   History of cancer    Prostate Cancer   Hyperlipidemia     PAST SURGICAL HISTORY: Past Surgical History:  Procedure Laterality Date   COLONOSCOPY  09/12/2021   Heme Positive/No Repeat/TKT   HERNIA REPAIR Left    inguinal     FAMILY HISTORY: Family History  Problem Relation Age of Onset   No Known Problems Mother    Cancer Father    Throat cancer Father      SOCIAL HISTORY: Social History   Socioeconomic History   Marital status: Married  Tobacco Use   Smoking status: Never   Smokeless tobacco: Never  Vaping Use   Vaping Use: Never used  Substance and Sexual Activity   Alcohol use: No   Drug use: Never   Sexual activity: Defer    PHYSICAL EXAM: Vitals:   12/06/21 1004  BP: 127/65  Pulse: 103   Body mass index is 24.97 kg/m. Weight: 78.9 kg (174 lb)   GENERAL:  Alert, active, oriented x3  HEENT: Pupils equal reactive to light. Extraocular movements are intact. Sclera clear. Palpebral conjunctiva normal red color.Pharynx clear.  NECK: Supple with no palpable mass and no adenopathy.  LUNGS: Sound clear with no rales rhonchi or wheezes.  HEART: Regular rhythm S1 and S2 without murmur.  ABDOMEN: Soft and depressible, nontender with no palpable mass, no hepatomegaly.  Left inguinal hernia, reducible.  EXTREMITIES: Well-developed well-nourished symmetrical with no dependent edema.  NEUROLOGICAL: Awake alert oriented, facial expression  symmetrical, moving all extremities.  REVIEW OF DATA: I have reviewed the following data today: Appointment on 09/26/2021  Component Date Value   WBC (White Blood Cell Co* 09/26/2021 5.1    RBC (Red Blood Cell Coun* 09/26/2021 4.42 (L)    Hemoglobin 09/26/2021 13.9 (L)    Hematocrit 09/26/2021 41.8    MCV (Mean Corpuscular Vo* 09/26/2021 94.6    MCH (Mean Corpuscular He* 09/26/2021 31.4 (H)    MCHC (Mean Corpuscular H* 09/26/2021 33.3    Platelet Count 09/26/2021 240    RDW-CV (Red Cell Distrib* 09/26/2021 13.2    MPV (Mean Platelet Volum* 09/26/2021 9.5    Neutrophils 09/26/2021 2.70    Lymphocytes 09/26/2021 2.00    Mixed Count 09/26/2021 0.40    Neutrophil % 09/26/2021 52.0    Lymphocyte % 09/26/2021 39.5    Mixed % 09/26/2021 8.5    Glucose 09/26/2021 84    Sodium 09/26/2021 139    Potassium 09/26/2021 4.0    Chloride 09/26/2021 106    Carbon Dioxide (CO2) 09/26/2021 30.2    Urea Nitrogen (BUN) 09/26/2021 16    Creatinine 09/26/2021 0.9    Glomerular Filtration Ra* 09/26/2021 100    Calcium 09/26/2021 8.9    AST  09/26/2021 24    ALT  09/26/2021 19    Alk Phos (alkaline Phosp* 09/26/2021 88    Albumin 09/26/2021 3.7    Bilirubin, Total 09/26/2021 0.6    Protein, Total 09/26/2021 6.7    A/G Ratio 09/26/2021 1.2    Cholesterol, Total 09/26/2021 209 (H)    Triglyceride 09/26/2021 144    HDL (High Density Lipopr* 09/26/2021 57.5    LDL Calculated 09/26/2021 123    VLDL Cholesterol 09/26/2021 29    Cholesterol/HDL Ratio 09/26/2021 3.6      ASSESSMENT: [name] is a 76 y.o. male presenting for consultation for left versus bilateral inguinal hernia.    The patient presents with a symptomatic, reducible left versus bilateral inguinal hernia. Patient was oriented about the diagnosis of inguinal hernia and its implication. The patient was oriented about the treatment alternatives (observation vs surgical repair). Due to patient symptoms, repair is recommended. Patient  oriented about the surgical procedure, the use of mesh and its risk of complications such as: infection, bleeding, injury to vas deference, vasculature and testicle, injury to bowel or bladder, and chronic pain.   Non-recurrent unilateral inguinal hernia without obstruction or gangrene [K40.90]  PLAN: 1.  Robotic assisted laparoscopic left vs bilateral inguinal hernia repair with mesh (56387) 2.  CBC, CMP done 3.  Avoid taking aspirin 5 days before procedure 4.  Contact us if has any question or concern.   Patient and is wife verbalized understanding, all questions were answered, and were agreeable with the plan outlined above.    Herbert Pun, MD  Electronically signed by Herbert Pun, MD

## 2021-12-06 NOTE — H&P (View-Only) (Signed)
PATIENT PROFILE: Naeem Hauschild is a 76 y.o. male who presents to the Clinic for consultation at the request of [place], [place] for evaluation of left groin hernia.  PCP:  Babaoff, Marcus Elijah, MD  HISTORY OF PRESENT ILLNESS: Mr. Morsch reports having a hernia in the left groin since about a year ago.  He has been feeling the swelling on the left groin.  There is a bulge.  He cannot reduce the bulge.  He feels some pain and discomfort on the left groin.  It is exacerbated by activities.  Alleviating factor resting and reducing the hernia.  Denies any episode of abdominal incision nausea or migraine.  Patient has history of right inguinal hernia repair more than 10 years ago.  Endorses some discomfort in the right groin.   PROBLEM LIST: Problem List  Date Reviewed: 10/14/2021          Noted   Prostate cancer (CMS-HCC) 09/24/2020   Hypercholesterolemia 09/24/2020    GENERAL REVIEW OF SYSTEMS:   General ROS: negative for - chills, fatigue, fever, weight gain or weight loss Allergy and Immunology ROS: negative for - hives  Hematological and Lymphatic ROS: negative for - bleeding problems or bruising, negative for palpable nodes Endocrine ROS: negative for - heat or cold intolerance, hair changes Respiratory ROS: negative for - cough, shortness of breath or wheezing Cardiovascular ROS: no chest pain or palpitations GI ROS: negative for nausea, vomiting, abdominal pain, diarrhea, constipation Musculoskeletal ROS: negative for - joint swelling or muscle pain Neurological ROS: negative for - confusion, syncope Dermatological ROS: negative for pruritus and rash Psychiatric: negative for anxiety, depression, difficulty sleeping and memory loss  MEDICATIONS: Current Outpatient Medications  Medication Sig Dispense Refill   ascorbic acid, vitamin C, (VITAMIN C) 1000 MG tablet Take 1,000 mg by mouth once daily     aspirin 81 MG EC tablet Take 81 mg by mouth once daily.     cholecalciferol (VITAMIN D3)  1000 unit capsule Take 1,000 Units by mouth once daily     RESTASIS 0.05 % ophthalmic emulsion      rosuvastatin (CRESTOR) 10 MG tablet Take 1 tablet (10 mg total) by mouth 3 (three) times a week 90 tablet 3   tamsulosin (FLOMAX) 0.4 mg capsule Take 0.4 mg by mouth once daily Take 30 minutes after same meal each day.     No current facility-administered medications for this visit.    ALLERGIES: Sulfamethoxazole-trimethoprim, Atorvastatin, and Ciprofloxacin  PAST MEDICAL HISTORY: Past Medical History:  Diagnosis Date   History of cancer    Prostate Cancer   Hyperlipidemia     PAST SURGICAL HISTORY: Past Surgical History:  Procedure Laterality Date   COLONOSCOPY  09/12/2021   Heme Positive/No Repeat/TKT   HERNIA REPAIR Left    inguinal     FAMILY HISTORY: Family History  Problem Relation Age of Onset   No Known Problems Mother    Cancer Father    Throat cancer Father      SOCIAL HISTORY: Social History   Socioeconomic History   Marital status: Married  Tobacco Use   Smoking status: Never   Smokeless tobacco: Never  Vaping Use   Vaping Use: Never used  Substance and Sexual Activity   Alcohol use: No   Drug use: Never   Sexual activity: Defer    PHYSICAL EXAM: Vitals:   12/06/21 1004  BP: 127/65  Pulse: 103   Body mass index is 24.97 kg/m. Weight: 78.9 kg (174 lb)   GENERAL:   Alert, active, oriented x3  HEENT: Pupils equal reactive to light. Extraocular movements are intact. Sclera clear. Palpebral conjunctiva normal red color.Pharynx clear.  NECK: Supple with no palpable mass and no adenopathy.  LUNGS: Sound clear with no rales rhonchi or wheezes.  HEART: Regular rhythm S1 and S2 without murmur.  ABDOMEN: Soft and depressible, nontender with no palpable mass, no hepatomegaly.  Left inguinal hernia, reducible.  EXTREMITIES: Well-developed well-nourished symmetrical with no dependent edema.  NEUROLOGICAL: Awake alert oriented, facial expression  symmetrical, moving all extremities.  REVIEW OF DATA: I have reviewed the following data today: Appointment on 09/26/2021  Component Date Value   WBC (White Blood Cell Co* 09/26/2021 5.1    RBC (Red Blood Cell Coun* 09/26/2021 4.42 (L)    Hemoglobin 09/26/2021 13.9 (L)    Hematocrit 09/26/2021 41.8    MCV (Mean Corpuscular Vo* 09/26/2021 94.6    MCH (Mean Corpuscular He* 09/26/2021 31.4 (H)    MCHC (Mean Corpuscular H* 09/26/2021 33.3    Platelet Count 09/26/2021 240    RDW-CV (Red Cell Distrib* 09/26/2021 13.2    MPV (Mean Platelet Volum* 09/26/2021 9.5    Neutrophils 09/26/2021 2.70    Lymphocytes 09/26/2021 2.00    Mixed Count 09/26/2021 0.40    Neutrophil % 09/26/2021 52.0    Lymphocyte % 09/26/2021 39.5    Mixed % 09/26/2021 8.5    Glucose 09/26/2021 84    Sodium 09/26/2021 139    Potassium 09/26/2021 4.0    Chloride 09/26/2021 106    Carbon Dioxide (CO2) 09/26/2021 30.2    Urea Nitrogen (BUN) 09/26/2021 16    Creatinine 09/26/2021 0.9    Glomerular Filtration Ra* 09/26/2021 100    Calcium 09/26/2021 8.9    AST  09/26/2021 24    ALT  09/26/2021 19    Alk Phos (alkaline Phosp* 09/26/2021 88    Albumin 09/26/2021 3.7    Bilirubin, Total 09/26/2021 0.6    Protein, Total 09/26/2021 6.7    A/G Ratio 09/26/2021 1.2    Cholesterol, Total 09/26/2021 209 (H)    Triglyceride 09/26/2021 144    HDL (High Density Lipopr* 09/26/2021 57.5    LDL Calculated 09/26/2021 123    VLDL Cholesterol 09/26/2021 29    Cholesterol/HDL Ratio 09/26/2021 3.6      ASSESSMENT: Mr. Gerding is a 76 y.o. male presenting for consultation for left versus bilateral inguinal hernia.    The patient presents with a symptomatic, reducible left versus bilateral inguinal hernia. Patient was oriented about the diagnosis of inguinal hernia and its implication. The patient was oriented about the treatment alternatives (observation vs surgical repair). Due to patient symptoms, repair is recommended. Patient  oriented about the surgical procedure, the use of mesh and its risk of complications such as: infection, bleeding, injury to vas deference, vasculature and testicle, injury to bowel or bladder, and chronic pain.   Non-recurrent unilateral inguinal hernia without obstruction or gangrene [K40.90]  PLAN: 1.  Robotic assisted laparoscopic left vs bilateral inguinal hernia repair with mesh (56387) 2.  CBC, CMP done 3.  Avoid taking aspirin 5 days before procedure 4.  Contact us if has any question or concern.   Patient and is wife verbalized understanding, all questions were answered, and were agreeable with the plan outlined above.    Herbert Pun, MD  Electronically signed by Herbert Pun, MD

## 2021-12-09 ENCOUNTER — Other Ambulatory Visit: Payer: Medicare HMO

## 2021-12-16 ENCOUNTER — Encounter
Admission: RE | Admit: 2021-12-16 | Discharge: 2021-12-16 | Disposition: A | Discharge status: Home / Self Care | Payer: Medicare HMO | Source: Ambulatory Visit | Attending: General Surgery | Admitting: General Surgery

## 2021-12-16 VITALS — Ht 71.0 in | Wt 175.0 lb

## 2021-12-16 DIAGNOSIS — Z01810 Encounter for preprocedural cardiovascular examination: Secondary | ICD-10-CM | POA: Insufficient documentation

## 2021-12-16 DIAGNOSIS — Z01812 Encounter for preprocedural laboratory examination: Secondary | ICD-10-CM

## 2021-12-16 HISTORY — DX: Cardiac murmur, unspecified: R01.1

## 2021-12-16 NOTE — Patient Instructions (Addendum)
Your procedure is scheduled on: Wednesday December 21, 2021. Report to Day Surgery inside [facility] 2nd floor, stop by admissions desk before getting on elevator. To find out your arrival time please call [phone] between 1PM - 3PM on Tuesday December 20, 2021.  Remember: Instructions that are not followed completely may result in serious medical risk,  up to and including death, or upon the discretion of your surgeon and anesthesiologist your  surgery may need to be rescheduled.     _X__ 1. Do not eat food or drink fluids after midnight the night before your procedure.                 No chewing gum or hard candies.   __X__2.  On the morning of surgery brush your teeth with toothpaste and water, you                may rinse your mouth with mouthwash if you wish.  Do not swallow any toothpaste or mouthwash.     _X__ 3.  No Alcohol for 24 hours before or after surgery.   _X__ 4.  Do Not Smoke or use e-cigarettes For 24 Hours Prior to Your Surgery.                 Do not use any chewable tobacco products for at least 6 hours prior to                 Surgery.  _X__  5.  Do not use any recreational drugs (marijuana, cocaine, heroin, ecstasy, MDMA or other)                For at least one week prior to your surgery.  Combination of these drugs with anesthesia                May have life threatening results.  ____  6.  Bring all medications with you on the day of surgery if instructed.   __X__  7.  Notify your doctor if there is any change in your medical condition      (cold, fever, infections).     Do not wear jewelry, make-up, hairpins, clips or nail polish. Do not wear lotions, powders, or perfumes. You may wear deodorant. Do not shave 48 hours prior to surgery. Men may shave face and neck. Do not bring valuables to the hospital.    [facility] is not responsible for any belongings or valuables.  Contacts, dentures or bridgework may not be worn into  surgery. Leave your suitcase in the car. After surgery it may be brought to your room. For patients admitted to the hospital, discharge time is determined by your treatment team.   Patients discharged the day of surgery will not be allowed to drive home.   Make arrangements for someone to be with you for the first 24 hours of your Same Day Discharge.   __X__ Take these medicines the morning of surgery with A SIP OF WATER:    1. None   2.   3.   4.  5.  6.  ____ Fleet Enema (as directed)   __X__ Use CHG Soap (or wipes) as directed  ____ Use Benzoyl Peroxide Gel as instructed  ____ Use inhalers on the day of surgery  ____ Stop metformin 2 days prior to surgery    ____ Take 1/2 of usual insulin dose the night before surgery. No insulin the morning  of surgery.   __X__ Stop aspirin 81 mg 5 days before your procedure as instructed by your doctor. (Take last dose 12/15/21)   __X__ One Week prior to surgery- Stop Anti-inflammatories such as Ibuprofen, Aleve, Advil, Motrin, meloxicam (MOBIC), diclofenac, etodolac, ketorolac, Toradol, Daypro, piroxicam, Goody's or BC powders. OK TO USE TYLENOL IF NEEDED   __X__ Stop supplements until after surgery. Ascorbic Acid (VITAMIN C)    ____ Bring C-Pap to the hospital.    If you have any questions regarding your pre-procedure instructions,  Please call Pre-admit Testing at [phone]

## 2021-12-21 ENCOUNTER — Ambulatory Visit: Payer: Medicare HMO | Admitting: Anesthesiology

## 2021-12-21 ENCOUNTER — Other Ambulatory Visit: Payer: Self-pay

## 2021-12-21 ENCOUNTER — Encounter: Payer: Self-pay | Admitting: General Surgery

## 2021-12-21 ENCOUNTER — Encounter
Admission: RE | Disposition: A | Discharge status: Home / Self Care | Payer: Self-pay | Source: Home / Self Care | Attending: General Surgery

## 2021-12-21 ENCOUNTER — Ambulatory Visit: Payer: Medicare HMO | Admitting: Urgent Care

## 2021-12-21 ENCOUNTER — Ambulatory Visit
Admission: RE | Admit: 2021-12-21 | Discharge: 2021-12-21 | Disposition: A | Discharge status: Home / Self Care | Payer: Medicare HMO | Attending: General Surgery | Admitting: General Surgery

## 2021-12-21 DIAGNOSIS — E7800 Pure hypercholesterolemia, unspecified: Secondary | ICD-10-CM | POA: Insufficient documentation

## 2021-12-21 DIAGNOSIS — Z8546 Personal history of malignant neoplasm of prostate: Secondary | ICD-10-CM | POA: Insufficient documentation

## 2021-12-21 DIAGNOSIS — K4090 Unilateral inguinal hernia, without obstruction or gangrene, not specified as recurrent: Secondary | ICD-10-CM | POA: Insufficient documentation

## 2021-12-21 HISTORY — PX: INSERTION OF MESH: SHX5868

## 2021-12-21 SURGERY — REPAIR, HERNIA, INGUINAL, BILATERAL, ROBOT-ASSISTED
Anesthesia: General | Site: Groin | Laterality: Left

## 2021-12-21 MED ORDER — ROCURONIUM BROMIDE 100 MG/10ML IV SOLN
INTRAVENOUS | Status: DC | PRN
  Administered 2021-12-21: 30 mg via INTRAVENOUS
  Administered 2021-12-21: 40 mg via INTRAVENOUS

## 2021-12-21 MED ORDER — OXYCODONE HCL 5 MG PO TABS
ORAL_TABLET | ORAL | Status: AC
  Filled 2021-12-21: qty 1

## 2021-12-21 MED ORDER — FAMOTIDINE 20 MG PO TABS: 20.0000 mg | ORAL_TABLET | Freq: Once | ORAL | Status: AC

## 2021-12-21 MED ORDER — BUPIVACAINE-EPINEPHRINE 0.25% -1:200000 IJ SOLN
INTRAMUSCULAR | Status: DC | PRN
  Administered 2021-12-21: 30 mL

## 2021-12-21 MED ORDER — LACTATED RINGERS IV SOLN: INTRAVENOUS | Status: DC

## 2021-12-21 MED ORDER — HYDROCODONE-ACETAMINOPHEN 5-325 MG PO TABS
1.0000 | ORAL_TABLET | ORAL | 0 refills | Status: AC | PRN
Start: 2021-12-21 — End: 2021-12-24

## 2021-12-21 MED ORDER — FENTANYL CITRATE (PF) 100 MCG/2ML IJ SOLN
INTRAMUSCULAR | Status: DC | PRN
  Administered 2021-12-21: 25 ug via INTRAVENOUS
  Administered 2021-12-21: 50 ug via INTRAVENOUS
  Administered 2021-12-21: 25 ug via INTRAVENOUS

## 2021-12-21 MED ORDER — ORAL CARE MOUTH RINSE: 15.0000 mL | Freq: Once | OROMUCOSAL | Status: AC

## 2021-12-21 MED ORDER — SUGAMMADEX SODIUM 200 MG/2ML IV SOLN
INTRAVENOUS | Status: DC | PRN
  Administered 2021-12-21: 200 mg via INTRAVENOUS

## 2021-12-21 MED ORDER — ACETAMINOPHEN 10 MG/ML IV SOLN: 1000.0000 mg | Freq: Once | INTRAVENOUS | Status: DC | PRN

## 2021-12-21 MED ORDER — CHLORHEXIDINE GLUCONATE 0.12 % MT SOLN
OROMUCOSAL | Status: AC
  Administered 2021-12-21: 15 mL via OROMUCOSAL
  Filled 2021-12-21: qty 15

## 2021-12-21 MED ORDER — OXYCODONE HCL 5 MG PO TABS
5.0000 mg | ORAL_TABLET | Freq: Once | ORAL | Status: AC | PRN
  Administered 2021-12-21: 5 mg via ORAL

## 2021-12-21 MED ORDER — FENTANYL CITRATE (PF) 100 MCG/2ML IJ SOLN
INTRAMUSCULAR | Status: AC
  Filled 2021-12-21: qty 2

## 2021-12-21 MED ORDER — CHLORHEXIDINE GLUCONATE 0.12 % MT SOLN: 15.0000 mL | Freq: Once | OROMUCOSAL | Status: AC

## 2021-12-21 MED ORDER — ACETAMINOPHEN 10 MG/ML IV SOLN
INTRAVENOUS | Status: AC
  Filled 2021-12-21: qty 100

## 2021-12-21 MED ORDER — MIDAZOLAM HCL 2 MG/2ML IJ SOLN
INTRAMUSCULAR | Status: AC
Start: 2021-12-21 — End: ?
  Filled 2021-12-21: qty 2

## 2021-12-21 MED ORDER — LACTATED RINGERS IV SOLN: INTRAVENOUS | Status: DC | PRN

## 2021-12-21 MED ORDER — PROPOFOL 10 MG/ML IV BOLUS
INTRAVENOUS | Status: DC | PRN
  Administered 2021-12-21: 110 mg via INTRAVENOUS

## 2021-12-21 MED ORDER — PHENYLEPHRINE HCL (PRESSORS) 10 MG/ML IV SOLN
INTRAVENOUS | Status: DC | PRN
  Administered 2021-12-21: 80 ug via INTRAVENOUS

## 2021-12-21 MED ORDER — DEXAMETHASONE SODIUM PHOSPHATE 10 MG/ML IJ SOLN
INTRAMUSCULAR | Status: DC | PRN
  Administered 2021-12-21: 8 mg via INTRAVENOUS

## 2021-12-21 MED ORDER — MIDAZOLAM HCL 2 MG/2ML IJ SOLN
INTRAMUSCULAR | Status: DC | PRN
  Administered 2021-12-21 (×2): 1 mg via INTRAVENOUS

## 2021-12-21 MED ORDER — PROPOFOL 1000 MG/100ML IV EMUL
INTRAVENOUS | Status: AC
Start: 2021-12-21 — End: ?
  Filled 2021-12-21: qty 100

## 2021-12-21 MED ORDER — ONDANSETRON HCL 4 MG/2ML IJ SOLN: 4.0000 mg | Freq: Once | INTRAMUSCULAR | Status: DC | PRN

## 2021-12-21 MED ORDER — FAMOTIDINE 20 MG PO TABS
ORAL_TABLET | ORAL | Status: AC
  Administered 2021-12-21: 20 mg via ORAL
  Filled 2021-12-21: qty 1

## 2021-12-21 MED ORDER — LIDOCAINE HCL (CARDIAC) PF 100 MG/5ML IV SOSY
PREFILLED_SYRINGE | INTRAVENOUS | Status: DC | PRN
  Administered 2021-12-21: 50 mg via INTRAVENOUS

## 2021-12-21 MED ORDER — FENTANYL CITRATE (PF) 100 MCG/2ML IJ SOLN
INTRAMUSCULAR | Status: AC
  Administered 2021-12-21: 25 ug via INTRAVENOUS
  Filled 2021-12-21: qty 2

## 2021-12-21 MED ORDER — FENTANYL CITRATE (PF) 100 MCG/2ML IJ SOLN
25.0000 ug | INTRAMUSCULAR | Status: DC | PRN
  Administered 2021-12-21: 50 ug via INTRAVENOUS
  Administered 2021-12-21: 25 ug via INTRAVENOUS

## 2021-12-21 MED ORDER — ONDANSETRON HCL 4 MG/2ML IJ SOLN
INTRAMUSCULAR | Status: DC | PRN
  Administered 2021-12-21: 4 mg via INTRAVENOUS

## 2021-12-21 MED ORDER — BUPIVACAINE-EPINEPHRINE (PF) 0.25% -1:200000 IJ SOLN
INTRAMUSCULAR | Status: AC
  Filled 2021-12-21: qty 30

## 2021-12-21 MED ORDER — OXYCODONE HCL 5 MG/5ML PO SOLN: 5.0000 mg | Freq: Once | ORAL | Status: AC | PRN

## 2021-12-21 MED ORDER — CEFAZOLIN SODIUM-DEXTROSE 2-4 GM/100ML-% IV SOLN
2.0000 g | INTRAVENOUS | Status: AC
  Administered 2021-12-21: 2 g via INTRAVENOUS

## 2021-12-21 MED ORDER — ACETAMINOPHEN 10 MG/ML IV SOLN
INTRAVENOUS | Status: DC | PRN
  Administered 2021-12-21: 1000 mg via INTRAVENOUS

## 2021-12-21 MED ORDER — CEFAZOLIN SODIUM-DEXTROSE 2-4 GM/100ML-% IV SOLN
INTRAVENOUS | Status: AC
  Filled 2021-12-21: qty 100

## 2021-12-21 SURGICAL SUPPLY — 53 items
ADH SKN CLS APL DERMABOND .7 (GAUZE/BANDAGES/DRESSINGS) ×2
BAG PRESSURE INF REUSE 1000 (BAG) IMPLANT
BLADE SURG SZ11 CARB STEEL (BLADE) ×3 IMPLANT
COVER TIP SHEARS 8 DVNC (MISCELLANEOUS) ×2 IMPLANT
COVER TIP SHEARS 8MM DA VINCI (MISCELLANEOUS) ×1
COVER WAND RF STERILE (DRAPES) ×3 IMPLANT
DERMABOND ADVANCED (GAUZE/BANDAGES/DRESSINGS) ×1
DERMABOND ADVANCED .7 DNX12 (GAUZE/BANDAGES/DRESSINGS) ×2 IMPLANT
DRAPE ARM DVNC X/XI (DISPOSABLE) ×6 IMPLANT
DRAPE COLUMN DVNC XI (DISPOSABLE) ×2 IMPLANT
DRAPE DA VINCI XI ARM (DISPOSABLE) ×3
DRAPE DA VINCI XI COLUMN (DISPOSABLE) ×1
ELECT REM PT RETURN 9FT ADLT (ELECTROSURGICAL) ×3
ELECTRODE REM PT RTRN 9FT ADLT (ELECTROSURGICAL) ×2 IMPLANT
GLOVE BIO SURGEON STRL SZ 6.5 (GLOVE) ×7 IMPLANT
GLOVE BIOGEL PI IND STRL 6.5 (GLOVE) ×4 IMPLANT
GLOVE BIOGEL PI INDICATOR 6.5 (GLOVE) ×3
GOWN STRL REUS W/ TWL LRG LVL3 (GOWN DISPOSABLE) ×6 IMPLANT
GOWN STRL REUS W/TWL LRG LVL3 (GOWN DISPOSABLE) ×12
IRRIGATOR SUCT 8 DISP DVNC XI (IRRIGATION / IRRIGATOR) IMPLANT
IRRIGATOR SUCTION 8MM XI DISP (IRRIGATION / IRRIGATOR)
IV CATH ANGIO 12GX3 LT BLUE (NEEDLE) IMPLANT
IV NS 1000ML (IV SOLUTION)
IV NS 1000ML BAXH (IV SOLUTION) IMPLANT
KIT PINK PAD W/HEAD ARE REST (MISCELLANEOUS) ×3
KIT PINK PAD W/HEAD ARM REST (MISCELLANEOUS) ×2 IMPLANT
LABEL OR SOLS (LABEL) IMPLANT
MANIFOLD NEPTUNE II (INSTRUMENTS) ×3 IMPLANT
MESH 3DMAX MID 4X6 LT LRG (Mesh General) IMPLANT
MESH 3DMAX MID 4X6 RT LRG (Mesh General) IMPLANT
MESH 3DMAX MID 5X7 LT XLRG (Mesh General) ×1 IMPLANT
MESH 3DMAX MID 5X7 RT XLRG (Mesh General) IMPLANT
NDL INSUFFLATION 14GA 120MM (NEEDLE) ×2 IMPLANT
NEEDLE HYPO 22GX1.5 SAFETY (NEEDLE) ×3 IMPLANT
NEEDLE INSUFFLATION 14GA 120MM (NEEDLE) ×3 IMPLANT
OBTURATOR OPTICAL STANDARD 8MM (TROCAR) ×1
OBTURATOR OPTICAL STND 8 DVNC (TROCAR) ×2
OBTURATOR OPTICALSTD 8 DVNC (TROCAR) ×2 IMPLANT
PACK LAP CHOLECYSTECTOMY (MISCELLANEOUS) ×3 IMPLANT
SEAL CANN UNIV 5-8 DVNC XI (MISCELLANEOUS) ×6 IMPLANT
SEAL XI 5MM-8MM UNIVERSAL (MISCELLANEOUS) ×3
SET TUBE SMOKE EVAC HIGH FLOW (TUBING) ×3 IMPLANT
SOLUTION ELECTROLUBE (MISCELLANEOUS) ×3 IMPLANT
SUT MNCRL 4-0 (SUTURE) ×6
SUT MNCRL 4-0 27XMFL (SUTURE) ×4
SUT VIC AB 2-0 SH 27 (SUTURE) ×3
SUT VIC AB 2-0 SH 27XBRD (SUTURE) ×2 IMPLANT
SUT VLOC 90 S/L VL9 GS22 (SUTURE) ×3 IMPLANT
SUTURE MNCRL 4-0 27XMF (SUTURE) ×2 IMPLANT
TAPE TRANSPORE STRL 2 31045 (GAUZE/BANDAGES/DRESSINGS) IMPLANT
TRAP FLUID SMOKE EVACUATOR (MISCELLANEOUS) ×3 IMPLANT
TRAY FOLEY MTR SLVR 16FR STAT (SET/KITS/TRAYS/PACK) ×3 IMPLANT
WATER STERILE IRR 500ML POUR (IV SOLUTION) ×2 IMPLANT

## 2021-12-21 NOTE — Interval H&P Note (Signed)
History and Physical Interval Note:  12/21/2021 6:57 AM  Ronald Fox  has presented today for surgery, with the diagnosis of K40.90 non recurrent unilateral inguinal hernia w/o obstruction or gangrene.  The various methods of treatment have been discussed with the patient and family. After consideration of risks, benefits and other options for treatment, the patient has consented to  Procedure(s): XI ROBOTIC ASSISTED BILATERAL INGUINAL HERNIA (Bilateral) as a surgical intervention.  The patient's history has been reviewed, patient examined, no change in status, stable for surgery.  I have reviewed the patient's chart and labs.  Questions were answered to the patient's satisfaction.     Herbert Pun

## 2021-12-21 NOTE — Anesthesia Postprocedure Evaluation (Signed)
Anesthesia Post Note  Patient: Ronald Fox  Procedure(s) Performed: XI ROBOTIC ASSISTED BILATERAL INGUINAL HERNIA (Left: Groin) INSERTION OF MESH  Patient location during evaluation: PACU Anesthesia Type: General Level of consciousness: awake and alert, oriented and patient cooperative Pain management: pain level controlled Vital Signs Assessment: post-procedure vital signs reviewed and stable Respiratory status: spontaneous breathing, nonlabored ventilation and respiratory function stable Cardiovascular status: blood pressure returned to baseline and stable Postop Assessment: adequate PO intake Anesthetic complications: no   No notable events documented.   Last Vitals:  Vitals:   12/21/21 1000 12/21/21 1013  BP: 137/77 (!) 144/83  Pulse: 80 80  Resp: 12 16  Temp: (!) 36.1 C (!) 36.2 C  SpO2: 95% 94%    Last Pain:  Vitals:   12/21/21 1013  TempSrc: Temporal  PainSc: [place]

## 2021-12-21 NOTE — Anesthesia Preprocedure Evaluation (Addendum)
Anesthesia Evaluation  Patient identified by MRN, date of birth, ID band Patient awake    Reviewed: Allergy & Precautions, NPO status , Patient's Chart, lab work & pertinent test results  History of Anesthesia Complications Negative for: history of anesthetic complications  Airway Mallampati: II   Neck ROM: Full    Dental  (+) Missing, Loose, Chipped   Pulmonary neg pulmonary ROS,    Pulmonary exam normal breath sounds clear to auscultation       Cardiovascular Exercise Tolerance: Good + Valvular Problems/Murmurs (murmur)  Rhythm:Regular Rate:Normal + Systolic murmurs ECG 12/14/82: Normal sinus rhythm Early repolarization   Neuro/Psych negative neurological ROS     GI/Hepatic negative GI ROS,   Endo/Other  negative endocrine ROS  Renal/GU negative Renal ROS   Prostate CA    Musculoskeletal   Abdominal   Peds  Hematology negative hematology ROS (+)   Anesthesia Other Findings   Reproductive/Obstetrics                            Anesthesia Physical Anesthesia Plan  ASA: 2  Anesthesia Plan: General   Post-op Pain Management:    Induction: Intravenous  PONV Risk Score and Plan: 2 and Ondansetron, Dexamethasone and Treatment may vary due to age or medical condition  Airway Management Planned: Oral ETT  Additional Equipment:   Intra-op Plan:   Post-operative Plan: Extubation in OR  Informed Consent: I have reviewed the patients History and Physical, chart, labs and discussed the procedure including the risks, benefits and alternatives for the proposed anesthesia with the patient or authorized representative who has indicated his/her understanding and acceptance.     Dental advisory given  Plan Discussed with: CRNA  Anesthesia Plan Comments: (Patient consented for risks of anesthesia including but not limited to:  - adverse reactions to medications - damage to eyes, teeth,  lips or other oral mucosa - nerve damage due to positioning  - sore throat or hoarseness - damage to heart, brain, nerves, lungs, other parts of body or loss of life  Informed patient about role of CRNA in peri- and intra-operative care.  Patient voiced understanding.)        Anesthesia Quick Evaluation

## 2021-12-21 NOTE — Discharge Instructions (Signed)

## 2021-12-21 NOTE — Op Note (Signed)
Preoperative diagnosis: Left inguinal hernia.   Postoperative diagnosis: Left inguinal hernia.  Procedure: Robotic assisted Laparoscopic Transabdominal preperitoneal laparoscopic (TAPP) repair of left inguinal hernia.  Anesthesia: GETA  Surgeon: Dr. Windell Moment  Wound Classification: Clean  Indications:  Patient is a 76 y.o. male developed a symptomatic left inguinal hernia. Repair was indicated.  Findings: 1. Left direct Inguinal hernia identified 2. Intact previous repair of right inguinal hernia. No sign of recurrence  3. Vas deferens and cord structures identified and preserved 4. Bard Extra Large 3D Max MID Anatomical mesh used for repair 5. Adequate hemostasis.        Description of procedure: The patient was taken to the operating room and the correct side of surgery was verified. The patient was placed supine with arms tucked at the sides. After obtaining adequate anesthesia, the patient's abdomen was prepped and draped in standard sterile fashion. The patient was placed in the Trendelenburg position. A time-out was completed verifying correct patient, procedure, site, positioning, and implant(s) and/or special equipment prior to beginning this procedure. A Veress needle was placed at the umbilicus and pneumoperitoneum created with insufflation of carbon dioxide to 15 mmHg. After the Veress needle was removed, an 8-mm trocar was placed on epigastric area and the 30 angled laparoscope inserted. Two 8-mm trocars were then placed lateral to the rectus sheath under direct visualization. Both inguinal regions were inspected and the median umbilical ligament, medial umbilical ligament, and lateral umbilical fold were identified.  The robotic arms were docked. The robotic scope was inserted and the pelvic area anatomy targeted.  The peritoneum was incised with scissors along a line 6 cm above the superior edge of the hernia defect, extending from the median umbilical ligament to the  anterior superior iliac spine. The peritoneal flap was mobilized inferiorly using blunt and sharp dissection. The inferior epigastric vessels were exposed and the pubic symphysis was identified. Cooper's ligament was dissected to its junction with the iliac vein. The dissection was continued inferiorly to the iliopubic tract, with care taken to avoid injury to the femoral branch of the genitofemoral nerve and the lateral femoral cutaneous nerve. The cord structures were parietalized. The hernia was identified and reduced by gentle traction.  The hernia sac was noted mobilized from the cord structures and reduced into the peritoneal cavity.  An extra large piece of mesh was rolled longitudinally into a compact cylinder and passed through a trocar. The cylinder was placed along the inferior aspect of the working space and unrolled into place to completely cover the direct, indirect, and femoral spaces. The mesh was secured into place superiorly to the anterior abdominal wall and inferiorly and medially to Cooper's ligament with absorbable sutures. Care was taken to avoid the inferolateral triangles containing the iliac vessels and genital nerves. The peritoneal flap was closed over the mesh and secured with suture in similar positions of safety. After ensuring adequate hemostasis, the trocars were removed and the pneumoperitoneum allowed to escape. The trocar incisions were closed using monocryl and skin adhesive dressings applied.  The patient tolerated the procedure well and was taken to the postanesthesia care unit in stable condition.   Specimen: None  Complications: None  Estimated Blood Loss: 3 mL

## 2021-12-21 NOTE — Anesthesia Procedure Notes (Signed)
Procedure Name: Intubation Date/Time: 12/21/2021 7:37 AM  Performed by: Beverely Low, CRNAPre-anesthesia Checklist: Patient identified, Patient being monitored, Timeout performed, Emergency Drugs available and Suction available Patient Re-evaluated:Patient Re-evaluated prior to induction Oxygen Delivery Method: Circle system utilized Preoxygenation: Pre-oxygenation with 100% oxygen Induction Type: IV induction Ventilation: Mask ventilation without difficulty Laryngoscope Size: McGraph and 4 Grade View: Grade II Tube type: Oral Tube size: 7.5 mm Number of attempts: 1 Airway Equipment and Method: Stylet Placement Confirmation: ETT inserted through vocal cords under direct vision, positive ETCO2 and breath sounds checked- equal and bilateral Secured at: 21 cm Tube secured with: Tape Dental Injury: Teeth and Oropharynx as per pre-operative assessment

## 2021-12-21 NOTE — Transfer of Care (Addendum)
Immediate Anesthesia Transfer of Care Note  Patient: Ronald Fox  Procedure(s) Performed: XI ROBOTIC ASSISTED BILATERAL INGUINAL HERNIA (Bilateral: Groin)  Patient Location: PACU  Anesthesia Type:General  Level of Consciousness: drowsy  Airway & Oxygen Therapy: Patient Spontanous Breathing and Patient connected to face mask oxygen  Post-op Assessment: Report given to RN and Post -op Vital signs reviewed and stable  Post vital signs: Reviewed and stable  Last Vitals:  Vitals Value Taken Time  BP    Temp    Pulse    Resp    SpO2      Last Pain:  Vitals:   12/21/21 0615  TempSrc: Oral  PainSc: 0-No pain         Complications: No notable events documented.

## 2022-12-27 DIAGNOSIS — M5090 Cervical disc disorder, unspecified, unspecified cervical region: Secondary | ICD-10-CM | POA: Insufficient documentation

## 2022-12-27 DIAGNOSIS — I700 Atherosclerosis of aorta: Secondary | ICD-10-CM | POA: Insufficient documentation

## 2023-07-08 ENCOUNTER — Ambulatory Visit
Admission: EM | Admit: 2023-07-08 | Discharge: 2023-07-08 | Disposition: A | Discharge status: Home / Self Care | Payer: Medicare HMO | Attending: Emergency Medicine | Admitting: Emergency Medicine

## 2023-07-08 DIAGNOSIS — J069 Acute upper respiratory infection, unspecified: Secondary | ICD-10-CM | POA: Diagnosis not present

## 2023-07-08 LAB — POC COVID19/FLU A&B COMBO
Covid Antigen, POC: NEGATIVE
Influenza A Antigen, POC: NEGATIVE
Influenza B Antigen, POC: NEGATIVE

## 2023-07-08 NOTE — ED Notes (Signed)
 Sx x 1 day  Productive cough with milky mucus Chest congestion No fever

## 2023-07-08 NOTE — ED Provider Notes (Signed)
 Renaldo Fiddler    CSN: [id number] Arrival date & time: 07/08/23  1443      History   Chief Complaint Chief Complaint  Patient presents with   Cough   Nasal Congestion    HPI Ronald Fox is a 78 y.o. male.  Patient presents with 1.5-day history of congestion and cough.  No fever, chest pain, shortness of breath, vomiting, diarrhea.  He has been treating his symptoms with Mucinex.  The history is provided by the patient and medical records.    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 Authorizing Provider  aspirin 81 MG EC tablet Take by mouth.   Yes [provider]  cholecalciferol (VITAMIN D3) 25 MCG (1000 UNIT) tablet Take 1,000 Units by mouth daily.   Yes [provider]  rosuvastatin (CRESTOR) 10 MG tablet  03/26/19  Yes [provider]  tamsulosin (FLOMAX) 0.4 MG CAPS capsule Take 2 capsules (0.8 mg total) by mouth daily after supper. 07/14/20  Yes Bruning, Ashlyn, PA-C  Ascorbic Acid (VITAMIN C) 1000 MG tablet Take 1,000 mg by mouth daily.    [provider]  cycloSPORINE (RESTASIS) 0.05 % ophthalmic emulsion 1 drop 2 (two) times daily.    [provider]   Multiple Vitamins-Minerals (PRESERVISION AREDS 2+MULTI VIT PO) Take by mouth.    [provider]    Family History Family History  Problem Relation Age of Onset   Throat cancer Father    Breast cancer Neg Hx    Colon cancer Neg Hx    Pancreatic cancer Neg Hx    Prostate cancer Neg Hx     Social History Social History   Tobacco Use   Smoking status: Never   Smokeless tobacco: Never  Vaping Use   Vaping status: Never Used  Substance Use Topics   Alcohol use: No   Drug use: Never     Allergies   Sulfamethoxazole-trimethoprim, Atorvastatin, and Ciprofloxacin   Review of Systems Review of Systems  Constitutional:  Negative for chills and fever.  HENT:  Positive for congestion. Negative for ear pain and sore throat.   Respiratory:  Positive for cough. Negative for shortness of breath.   Cardiovascular:  Negative for chest pain and palpitations.  Gastrointestinal:  Negative for diarrhea and vomiting.     Physical Exam Triage Vital Signs ED Triage Vitals  Encounter Vitals Group     BP 07/08/23 1525 113/73     Systolic BP Percentile --      Diastolic BP Percentile --      Pulse Rate 07/08/23 1525 76     Resp 07/08/23 1525 17  Temp 07/08/23 1525 98.4 F (36.9 C)     Temp Source 07/08/23 1525 Oral     SpO2 07/08/23 1525 95 %     Weight --      Height --      Head Circumference --      Peak Flow --      Pain Score 07/08/23 1523 0     Pain Loc --      Pain Education --      Exclude from Growth Chart --    No data found.  Updated Vital Signs BP 113/73 (BP Location: Right Arm)   Pulse 76   Temp 98.4 F (36.9 C) (Oral)   Resp 17   SpO2 95%   Visual Acuity Right Eye Distance:   Left Eye Distance:   Bilateral Distance:    Right Eye Near:   Left Eye Near:    Bilateral Near:     Physical Exam Constitutional:      General: He is not in acute distress. HENT:     Right Ear: Tympanic membrane normal.     Left Ear: Tympanic membrane normal.      Nose: Rhinorrhea present.     Mouth/Throat:     Mouth: Mucous membranes are moist.     Pharynx: Oropharynx is clear.  Cardiovascular:     Rate and Rhythm: Normal rate and regular rhythm.     Heart sounds: Normal heart sounds.  Pulmonary:     Effort: Pulmonary effort is normal. No respiratory distress.     Breath sounds: Normal breath sounds.  Neurological:     Mental Status: He is alert.      UC Treatments / Results  Labs (all labs ordered are listed, but only abnormal results are displayed) Labs Reviewed  POC COVID19/FLU A&B COMBO    EKG   Radiology No results found.  Procedures Procedures (including critical care time)  Medications Ordered in UC Medications - No data to display  Initial Impression / Assessment and Plan / UC Course  I have reviewed the triage vital signs and the nursing notes.  Pertinent labs & imaging results that were available during my care of the patient were reviewed by me and considered in my medical decision making (see chart for details).    Viral URI.  Afebrile and vital signs are stable.  Lungs are clear and O2 sat is 95% on room air.  Rapid COVID and flu negative.  Discussed symptomatic treatment including Tylenol as needed for fever or discomfort, plain Mucinex as needed for congestion, rest, hydration.  Instructed patient to follow-up with his PCP if he is not improving.  ED precautions given.  Patient agrees to plan of care.   Final Clinical Impressions(s) / UC Diagnoses   Final diagnoses:  Viral URI     Discharge Instructions      The COVID and flu tests are negative.   Take Tylenol as needed for fever or discomfort.  Take plain Mucinex as needed for congestion.  Rest and keep yourself hydrated.    Follow-up with your primary care provider if your symptoms are not improving.         ED Prescriptions   None    PDMP not reviewed this encounter.   Mickie Bail, NP 07/08/23 [phone]

## 2023-07-08 NOTE — ED Triage Notes (Signed)
 Patient states 2 days of cough/congestion.

## 2023-07-08 NOTE — Discharge Instructions (Signed)
 The COVID and flu tests are negative.   Take Tylenol as needed for fever or discomfort.  Take plain Mucinex as needed for congestion.  Rest and keep yourself hydrated.    Follow-up with your primary care provider if your symptoms are not improving.

## 2023-09-06 DIAGNOSIS — Z8719 Personal history of other diseases of the digestive system: Secondary | ICD-10-CM | POA: Insufficient documentation

## 2023-09-06 NOTE — Patient Instructions (Signed)

## 2023-09-11 ENCOUNTER — Ambulatory Visit: Payer: Self-pay | Admitting: Nurse Practitioner

## 2023-09-11 ENCOUNTER — Encounter: Payer: Self-pay | Admitting: Nurse Practitioner

## 2023-09-11 VITALS — BP 100/61 | HR 74 | Temp 98.7°F | Ht 70.3 in | Wt 172.2 lb

## 2023-09-11 DIAGNOSIS — I700 Atherosclerosis of aorta: Secondary | ICD-10-CM | POA: Diagnosis not present

## 2023-09-11 DIAGNOSIS — G250 Essential tremor: Secondary | ICD-10-CM | POA: Diagnosis not present

## 2023-09-11 DIAGNOSIS — R350 Frequency of micturition: Secondary | ICD-10-CM

## 2023-09-11 DIAGNOSIS — Z7689 Persons encountering health services in other specified circumstances: Secondary | ICD-10-CM

## 2023-09-11 DIAGNOSIS — C61 Malignant neoplasm of prostate: Secondary | ICD-10-CM

## 2023-09-11 DIAGNOSIS — N401 Enlarged prostate with lower urinary tract symptoms: Secondary | ICD-10-CM

## 2023-09-11 DIAGNOSIS — Z8719 Personal history of other diseases of the digestive system: Secondary | ICD-10-CM

## 2023-09-11 NOTE — Assessment & Plan Note (Signed)
 Chronic, ongoing. Followed by urology.  AUA = 14.  Has ongoing symptoms after prostate cancer treatment.  Continue current medication regimen and collaboration with urology.

## 2023-09-11 NOTE — Assessment & Plan Note (Signed)
 Ongoing and followed by urology every 6 months.  Has been through treatments and recent PSA stable.  Continue this collaboration.  Recent notes reviewed.

## 2023-09-11 NOTE — Progress Notes (Signed)
 New Patient Office Visit  Subjective    Patient ID: Ronald Fox, male    DOB: 08-Jun-1945  Age: 78 y.o. MRN: [id number]  CC:  Chief Complaint  Patient presents with   Hyperlipidemia   Prostate Cancer    HPI Ronald Fox presents for new patient visit to establish care.  Introduced to Publishing rights manager role and practice setting.  All questions answered.  Discussed provider/patient relationship and expectations. Went to [facility] for primary care, Ronald Fox, for awhile, but then switched to Dr. Babaoff at [facility] for a period.  Has essential tremor, similar to mother who had similar.  R>L upper extremities. Saw neurology and had testing in 2017, location in [place].  Reports no significant findings or medications provided.  Prostate Cancer Follows with urology every 6 months, last 02/15/23.  Has been through treatments. Takes Flomax  nightly. BPH status: stable Satisfied with current treatment?: yes Medication side effects: no Medication compliance: good compliance Duration: chronic Nocturia: 1-2x per night Urinary frequency:yes Incomplete voiding: yes Urgency: yes Weak urinary stream: yes Straining to start stream: no Dysuria: no Onset: gradual Severity: moderate Alleviating factors: nothing Aggravating factors: drinking too much water Treatments attempted: as above IPSS Questionnaire (AUA-7): 14 Over the past month.   1)  How often have you had a sensation of not emptying your bladder completely after you finish urinating?  2 - Less than half the time  2)  How often have you had to urinate again less than two hours after you finished urinating? 3 - About half the time  3)  How often have you found you stopped and started again several times when you urinated?  2 - Less than half the time  4) How difficult have you found it to postpone urination?  2 - Less than half the time  5) How often have you had a weak urinary stream?  2 - Less than half the time  6) How often  have you had to push or strain to begin urination?  1 - Less than 1 time in 5  7) How many times did you most typically get up to urinate from the time you went to bed until the time you got up in the morning?  2 - 2 times  Total score:  0-7 mildly symptomatic   8-19 moderately symptomatic   20-35 severely symptomatic   HYPERLIPIDEMIA Currently taking Rosuvastatin 3 times weekly.  Has taken others but these did not make him feel good. Hyperlipidemia status: good compliance Satisfied with current treatment?  yes Side effects:  no Medication compliance: good compliance Supplements: none Aspirin:  yes The ASCVD Risk score (Arnett DK, et al., 2019) failed to calculate for the following reasons:   Cannot find a previous HDL lab   Cannot find a previous total cholesterol lab Chest pain:  no Coronary artery disease:  no Family history CAD:  no Family history early CAD:  no   Outpatient Encounter Medications as of 09/11/2023  Medication Sig   ascorbic acid (VITAMIN C) 500 MG tablet Take 500 mg by mouth daily.   aspirin 81 MG EC tablet Take 81 mg by mouth daily.   cholecalciferol (VITAMIN D3) 25 MCG (1000 UNIT) tablet Take 1,000 Units by mouth daily.   cycloSPORINE (RESTASIS) 0.05 % ophthalmic emulsion 1 drop 2 (two) times daily.   rosuvastatin (CRESTOR) 10 MG tablet Take 10 mg by mouth 3 (three) times a week.   tadalafil (CIALIS) 20 MG tablet Take 20 mg  by mouth daily as needed for erectile dysfunction.   tamsulosin  (FLOMAX ) 0.4 MG CAPS capsule Take 0.4 mg by mouth daily.   [DISCONTINUED] Ascorbic Acid (VITAMIN C) 1000 MG tablet Take 1,000 mg by mouth daily.   [DISCONTINUED] finasteride (PROSCAR) 5 MG tablet Take 5 mg by mouth daily.   [DISCONTINUED] tamsulosin  (FLOMAX ) 0.4 MG CAPS capsule Take 2 capsules (0.8 mg total) by mouth daily after supper.   [DISCONTINUED] Multiple Vitamins-Minerals (PRESERVISION AREDS 2+MULTI VIT PO) Take by mouth.   No facility-administered encounter medications  on file as of 09/11/2023.    Past Medical History:  Diagnosis Date   Heart murmur    when was a child   Hyperlipidemia    Prostate cancer [facility])     Past Surgical History:  Procedure Laterality Date   HERNIA REPAIR Right 1993   inguinal hernia   INSERTION OF MESH  12/21/2021   Procedure: INSERTION OF MESH;  Surgeon: Eldred Grego, MD;  Location: [facility] ORS;  Service: General;;   PROSTATE BIOPSY      Family History  Problem Relation Age of Onset   Throat cancer Father    Glaucoma Sister    Breast cancer Neg Hx    Colon cancer Neg Hx    Pancreatic cancer Neg Hx    Prostate cancer Neg Hx     Social History   Socioeconomic History   Marital status: Married    Spouse name: Not on file   Number of children: 2   Years of education: Not on file   Highest education level: Not on file  Occupational History   Not on file  Tobacco Use   Smoking status: Never   Smokeless tobacco: Never  Vaping Use   Vaping status: Never Used  Substance and Sexual Activity   Alcohol use: No   Drug use: Never   Sexual activity: Yes  Other Topics Concern   Not on file  Social History Narrative   Not on file   Social Drivers of Health   Financial Resource Strain: Low Risk  (09/11/2023)   Overall Financial Resource Strain (CARDIA)    Difficulty of Paying Living Expenses: Not hard at all  Food Insecurity: No Food Insecurity (09/11/2023)   Hunger Vital Sign    Worried About Running Out of Food in the Last Year: Never true    Ran Out of Food in the Last Year: Never true  Transportation Needs: No Transportation Needs (09/11/2023)   PRAPARE - Administrator, Civil Service (Medical): No    Lack of Transportation (Non-Medical): No  Physical Activity: Insufficiently Active (09/11/2023)   Exercise Vital Sign    Days of Exercise per Week: 2 days    Minutes of Exercise per Session: 10 min  Stress: No Stress Concern Present (09/11/2023)   [employer] of Occupational Health -  Occupational Stress Questionnaire    Feeling of Stress : Not at all  Social Connections: Socially Integrated (09/11/2023)   Social Connection and Isolation Panel [NHANES]    Frequency of Communication with Friends and Family: More than three times a week    Frequency of Social Gatherings with Friends and Family: More than three times a week    Attends Religious Services: More than 4 times per year    Active Member of [employer] or Organizations: Yes    Attends Banker Meetings: Never    Marital Status: Married  Catering manager Violence: Not At Risk (09/11/2023)   Humiliation, Afraid, Rape, and  Kick questionnaire    Fear of Current or Ex-Partner: No    Emotionally Abused: No    Physically Abused: No    Sexually Abused: No    Review of Systems  Constitutional:  Negative for chills, diaphoresis, fever and weight loss.  Respiratory:  Negative for cough, sputum production, shortness of breath and wheezing.   Cardiovascular:  Negative for chest pain, palpitations, orthopnea and leg swelling.  Genitourinary:  Positive for frequency and urgency. Negative for dysuria and hematuria.  Neurological: Negative.   Endo/Heme/Allergies: Negative.   Psychiatric/Behavioral: Negative.         Objective    BP 100/61   Pulse 74   Temp 98.7 F (37.1 C) (Oral)   Ht 5' 10.3" (1.786 m)   Wt 172 lb 3.2 oz (78.1 kg)   SpO2 97%   BMI 24.50 kg/m   Physical Exam Vitals and nursing note reviewed.  Constitutional:      General: He is awake. He is not in acute distress.    Appearance: He is well-developed and well-groomed. He is not ill-appearing or toxic-appearing.  HENT:     Head: Normocephalic.     Right Ear: Hearing and external ear normal.     Left Ear: Hearing and external ear normal.  Eyes:     General: Lids are normal.     Extraocular Movements: Extraocular movements intact.     Conjunctiva/sclera: Conjunctivae normal.  Neck:     Thyroid : No thyromegaly.     Vascular: No carotid  bruit.  Cardiovascular:     Rate and Rhythm: Normal rate and regular rhythm.     Heart sounds: Normal heart sounds. No murmur heard.    No gallop.  Pulmonary:     Effort: No accessory muscle usage or respiratory distress.     Breath sounds: Normal breath sounds.  Abdominal:     General: Bowel sounds are normal. There is no distension.     Palpations: Abdomen is soft.     Tenderness: There is no abdominal tenderness.  Musculoskeletal:     Cervical back: Full passive range of motion without pain.     Right lower leg: No edema.     Left lower leg: No edema.  Lymphadenopathy:     Cervical: No cervical adenopathy.  Skin:    General: Skin is warm.     Capillary Refill: Capillary refill takes less than 2 seconds.  Neurological:     Mental Status: He is alert and oriented to person, place, and time.     Deep Tendon Reflexes: Reflexes are normal and symmetric.     Reflex Scores:      Brachioradialis reflexes are 2+ on the right side and 2+ on the left side.      Patellar reflexes are 2+ on the right side and 2+ on the left side. Psychiatric:        Attention and Perception: Attention normal.        Mood and Affect: Mood normal.        Speech: Speech normal.        Behavior: Behavior normal. Behavior is cooperative.        Thought Content: Thought content normal.    Last CBC Lab Results  Component Value Date   WBC 4.6 03/20/2015   HGB 15.0 03/20/2015   HCT 44.8 03/20/2015   MCV 92.7 03/20/2015   MCH 31.1 03/20/2015   RDW 13.0 03/20/2015   PLT 232 03/20/2015   Last metabolic panel Lab  Results  Component Value Date   GLUCOSE 109 (H) 03/20/2015   NA 138 03/20/2015   K 3.8 03/20/2015   CL 107 03/20/2015   CO2 28 03/20/2015   BUN 19 03/20/2015   CREATININE 1.09 03/20/2015   GFRNONAA >60 03/20/2015   CALCIUM 9.3 03/20/2015   ANIONGAP 3 (L) 03/20/2015        Assessment & Plan:   Problem List Items Addressed This Visit       Cardiovascular and Mediastinum    Atherosclerosis of aorta (HCC)   Noted on past imaging.  Continue statin therapy and Baby ASA daily.  Monitor closely.  Check labs next visit.      Relevant Medications   tadalafil (CIALIS) 20 MG tablet     Nervous and Auditory   Benign essential tremor   Chronic, mother had similar.  He did see neurology in 2017 for this per patient.  No current medications. Monitor closely and consider Propranolol if needed in future, as long as HR allows.        Genitourinary   Malignant neoplasm of prostate (HCC) - Primary   Ongoing and followed by urology every 6 months.  Has been through treatments and recent PSA stable.  Continue this collaboration.  Recent notes reviewed.      Benign prostate hyperplasia   Chronic, ongoing. Followed by urology.  AUA = 14.  Has ongoing symptoms after prostate cancer treatment.  Continue current medication regimen and collaboration with urology.      Relevant Medications   tamsulosin  (FLOMAX ) 0.4 MG CAPS capsule   Other Visit Diagnoses       Encounter to establish care       New patient to clinic, introduced to provider and practice setting.       Return in about 1 month (around 10/11/2023) for Annual Physical.   Suzanna Zahn T Seraj Dunnam, NP

## 2023-09-11 NOTE — Assessment & Plan Note (Signed)
 Noted on past imaging.  Continue statin therapy and Baby ASA daily.  Monitor closely.  Check labs next visit.

## 2023-09-11 NOTE — Assessment & Plan Note (Signed)
 Chronic, mother had similar.  He did see neurology in 2017 for this per patient.  No current medications. Monitor closely and consider Propranolol if needed in future, as long as HR allows.

## 2023-10-07 NOTE — Patient Instructions (Signed)
 Be Involved in Caring For Your Health:  Taking Medications When medications are taken as directed, they can greatly improve your health. But if they are not taken as prescribed, they may not work. In some cases, not taking them correctly can be harmful. To help ensure your treatment remains effective and safe, understand your medications and how to take them. Bring your medications to each visit for review by your provider.  Your lab results, notes, and after visit summary will be available on My Chart. We strongly encourage you to use this feature. If lab results are abnormal the clinic will contact you with the appropriate steps. If the clinic does not contact you assume the results are satisfactory. You can always view your results on My Chart. If you have questions regarding your health or results, please contact the clinic during office hours. You can also ask questions on My Chart.  We at [facility] are grateful that you chose [place] to provide your care. We strive to provide evidence-based and compassionate care and are always looking for feedback. If you get a survey from the clinic please complete this so we can hear your opinions.  Heart-Healthy Eating Plan Many factors influence your heart health, including eating and exercise habits. Heart health is also called coronary health. Coronary risk increases with abnormal blood fat (lipid) levels. A heart-healthy eating plan includes limiting unhealthy fats, increasing healthy fats, limiting salt (sodium) intake, and making other diet and lifestyle changes. What is my plan? Your health care provider may recommend that: You limit your fat intake to _________% or less of your total calories each day. You limit your saturated fat intake to _________% or less of your total calories each day. You limit the amount of cholesterol in your diet to less than _________ mg per day. You limit the amount of sodium in your diet to less than _________  mg per day. What are tips for following this plan? Cooking Cook foods using methods other than frying. Baking, boiling, grilling, and broiling are all good options. Other ways to reduce fat include: Removing the skin from poultry. Removing all visible fats from meats. Steaming vegetables in water or broth. Meal planning  At meals, imagine dividing your plate into fourths: Fill one-half of your plate with vegetables and green salads. Fill one-fourth of your plate with whole grains. Fill one-fourth of your plate with lean protein foods. Eat 2-4 cups of vegetables per day. One cup of vegetables equals 1 cup (91 g) broccoli or cauliflower florets, 2 medium carrots, 1 large bell pepper, 1 large sweet potato, 1 large tomato, 1 medium white potato, 2 cups (150 g) raw leafy greens. Eat 1-2 cups of fruit per day. One cup of fruit equals 1 small apple, 1 large banana, 1 cup (237 g) mixed fruit, 1 large orange,  cup (82 g) dried fruit, 1 cup (240 mL) 100% fruit juice. Eat more foods that contain soluble fiber. Examples include apples, broccoli, carrots, beans, peas, and barley. Aim to get 25-30 g of fiber per day. Increase your consumption of legumes, nuts, and seeds to 4-5 servings per week. One serving of dried beans or legumes equals  cup (90 g) cooked, 1 serving of nuts is  oz (12 almonds, 24 pistachios, or 7 walnut halves), and 1 serving of seeds equals  oz (8 g). Fats Choose healthy fats more often. Choose monounsaturated and polyunsaturated fats, such as olive and canola oils, avocado oil, flaxseeds, walnuts, almonds, and seeds. Eat  more omega-3 fats. Choose salmon, mackerel, sardines, tuna, flaxseed oil, and ground flaxseeds. Aim to eat fish at least 2 times each week. Check food labels carefully to identify foods with trans fats or high amounts of saturated fat. Limit saturated fats. These are found in animal products, such as meats, butter, and cream. Plant sources of saturated fats  include palm oil, palm kernel oil, and coconut oil. Avoid foods with partially hydrogenated oils in them. These contain trans fats. Examples are stick margarine, some tub margarines, cookies, crackers, and other baked goods. Avoid fried foods. General information Eat more home-cooked food and less restaurant, buffet, and fast food. Limit or avoid alcohol. Limit foods that are high in added sugar and simple starches such as foods made using white refined flour (white breads, pastries, sweets). Lose weight if you are overweight. Losing just 5-10% of your body weight can help your overall health and prevent diseases such as diabetes and heart disease. Monitor your sodium intake, especially if you have high blood pressure. Talk with your health care provider about your sodium intake. Try to incorporate more vegetarian meals weekly. What foods should I eat? Fruits All fresh, canned (in natural juice), or frozen fruits. Vegetables Fresh or frozen vegetables (raw, steamed, roasted, or grilled). Green salads. Grains Most grains. Choose whole wheat and whole grains most of the time. Rice and pasta, including brown rice and pastas made with whole wheat. Meats and other proteins Lean, well-trimmed beef, veal, pork, and lamb. Chicken and [place] without skin. All fish and shellfish. Wild duck, rabbit, pheasant, and venison. Egg whites or low-cholesterol egg substitutes. Dried beans, peas, lentils, and tofu. Seeds and most nuts. Dairy Low-fat or nonfat cheeses, including ricotta and mozzarella. Skim or 1% milk (liquid, powdered, or evaporated). Buttermilk made with low-fat milk. Nonfat or low-fat yogurt. Fats and oils Non-hydrogenated (trans-free) margarines. Vegetable oils, including soybean, sesame, sunflower, olive, avocado, peanut, safflower, corn, canola, and cottonseed. Salad dressings or mayonnaise made with a vegetable oil. Beverages Water (mineral or sparkling). Coffee and tea. Unsweetened ice  tea. Diet beverages. Sweets and desserts Sherbet, gelatin, and fruit ice. Small amounts of dark chocolate. Limit all sweets and desserts. Seasonings and condiments All seasonings and condiments. The items listed above may not be a complete list of foods and beverages you can eat. Contact a dietitian for more options. What foods should I avoid? Fruits Canned fruit in heavy syrup. Fruit in cream or butter sauce. Fried fruit. Limit coconut. Vegetables Vegetables cooked in cheese, cream, or butter sauce. Fried vegetables. Grains Breads made with saturated or trans fats, oils, or whole milk. Croissants. Sweet rolls. Donuts. High-fat crackers, such as cheese crackers and chips. Meats and other proteins Fatty meats, such as hot dogs, ribs, sausage, bacon, rib-eye roast or steak. High-fat deli meats, such as salami and bologna. Caviar. Domestic duck and goose. Organ meats, such as liver. Dairy Cream, sour cream, cream cheese, and creamed cottage cheese. Whole-milk cheeses. Whole or 2% milk (liquid, evaporated, or condensed). Whole buttermilk. Cream sauce or high-fat cheese sauce. Whole-milk yogurt. Fats and oils Meat fat, or shortening. Cocoa butter, hydrogenated oils, palm oil, coconut oil, palm kernel oil. Solid fats and shortenings, including bacon fat, salt pork, lard, and butter. Nondairy cream substitutes. Salad dressings with cheese or sour cream. Beverages Regular sodas and any drinks with added sugar. Sweets and desserts Frosting. Pudding. Cookies. Cakes. Pies. Milk chocolate or white chocolate. Buttered syrups. Full-fat ice cream or ice cream drinks. The items listed above may  not be a complete list of foods and beverages to avoid. Contact a dietitian for more information. Summary Heart-healthy meal planning includes limiting unhealthy fats, increasing healthy fats, limiting salt (sodium) intake and making other diet and lifestyle changes. Lose weight if you are overweight. Losing just  5-10% of your body weight can help your overall health and prevent diseases such as diabetes and heart disease. Focus on eating a balance of foods, including fruits and vegetables, low-fat or nonfat dairy, lean protein, nuts and legumes, whole grains, and heart-healthy oils and fats. This information is not intended to replace advice given to you by your health care provider. Make sure you discuss any questions you have with your health care provider. Document Revised: 06/06/2021 Document Reviewed: 06/06/2021 [employer]vier Patient Education  2024 [employer].

## 2023-10-11 ENCOUNTER — Encounter: Payer: Self-pay | Admitting: Nurse Practitioner

## 2023-10-11 ENCOUNTER — Ambulatory Visit (INDEPENDENT_AMBULATORY_CARE_PROVIDER_SITE_OTHER): Admitting: Nurse Practitioner

## 2023-10-11 VITALS — BP 97/60 | HR 79 | Temp 97.9°F | Ht 70.3 in | Wt 171.2 lb

## 2023-10-11 DIAGNOSIS — N401 Enlarged prostate with lower urinary tract symptoms: Secondary | ICD-10-CM | POA: Diagnosis not present

## 2023-10-11 DIAGNOSIS — E7800 Pure hypercholesterolemia, unspecified: Secondary | ICD-10-CM

## 2023-10-11 DIAGNOSIS — Z1159 Encounter for screening for other viral diseases: Secondary | ICD-10-CM | POA: Diagnosis not present

## 2023-10-11 DIAGNOSIS — I700 Atherosclerosis of aorta: Secondary | ICD-10-CM | POA: Diagnosis not present

## 2023-10-11 DIAGNOSIS — Z0000 Encounter for general adult medical examination without abnormal findings: Secondary | ICD-10-CM

## 2023-10-11 DIAGNOSIS — M545 Low back pain, unspecified: Secondary | ICD-10-CM | POA: Insufficient documentation

## 2023-10-11 DIAGNOSIS — G250 Essential tremor: Secondary | ICD-10-CM

## 2023-10-11 DIAGNOSIS — R350 Frequency of micturition: Secondary | ICD-10-CM

## 2023-10-11 DIAGNOSIS — C61 Malignant neoplasm of prostate: Secondary | ICD-10-CM | POA: Diagnosis not present

## 2023-10-11 MED ORDER — ROSUVASTATIN CALCIUM 10 MG PO TABS
10.0000 mg | ORAL_TABLET | ORAL | 4 refills | Status: AC
Start: 2023-10-12 — End: ?

## 2023-10-11 NOTE — Assessment & Plan Note (Signed)
 Acute.  Suspect more muscular in nature based on exam and HPI.  No red flags on exam.  Recommend simple treatment at home to include Tylenol  as needed + OTC Lidocaine  patches.  Use heating pad as needed.  If any worsening to alert provider.

## 2023-10-11 NOTE — Assessment & Plan Note (Signed)
 Chronic, mother had similar.  He did see neurology in 2017 for this per patient and had full work-up which was reassuring.  No current medications. Monitor closely and consider Propranolol if needed in future, as long as HR allows.

## 2023-10-11 NOTE — Assessment & Plan Note (Signed)
 Ongoing and followed by urology every 6 months.  Has been through treatments and recent PSA stable.  Continue this collaboration.  Recent notes reviewed.

## 2023-10-11 NOTE — Progress Notes (Signed)
 BP 97/60   Pulse 79   Temp 97.9 F (36.6 C) (Oral)   Ht 5' 10.3" (1.786 m)   Wt 171 lb 3.2 oz (77.7 kg)   SpO2 97%   BMI 24.36 kg/m    Subjective:    Patient ID: Ronald Fox, male    DOB: 1945/11/09, 78 y.o.   MRN: [id number]  HPI: Ronald Fox is a 78 y.o. male presenting on 10/11/2023 for comprehensive medical examination. Current medical complaints include: back pain  She currently lives with: wife  BACK PAIN Every now and then will get twinge to lower back.  Has been hurting for one month.  Did heavier lifting at workplace but recently resigned. Duration: weeks Mechanism of injury: unknown Location: Right, Left, and low back Onset: gradual Severity: 5/10 Quality: dull, aching, and throbbing Frequency: intermittent Radiation: none Aggravating factors: lifting, movement, and prolonged sitting Alleviating factors: Lidocaine  patches Status: fluctuating Treatments attempted: Lidocaine  patches  Relief with NSAIDs?: No NSAIDs Taken Nighttime pain:  no Paresthesias / decreased sensation:  no Bowel / bladder incontinence:  no Fevers:  no Dysuria / urinary frequency:  no   HYPERLIPIDEMIA Takes Rosuvastatin 3 times weekly.  Other statins caused side effects. Hyperlipidemia status: good compliance Satisfied with current treatment?  yes Side effects:  no Medication compliance: good compliance Supplements: none Aspirin:  yes The ASCVD Risk score (Arnett DK, et al., 2019) failed to calculate for the following reasons:   Cannot find a previous HDL lab   Cannot find a previous total cholesterol lab Chest pain:  no Coronary artery disease:  no Family history CAD:  no Family history early CAD:  no   Prostate Cancer Has visits with urology every 6 months, last 02/15/23.  Has been through treatments. Takes Flomax  nightly. BPH status: stable Satisfied with current treatment?: yes Medication side effects: no Medication compliance: good compliance Duration:  chronic Nocturia: 1-2x per night Urinary frequency:yes Incomplete voiding: yes Urgency: yes Weak urinary stream: yes Straining to start stream: no Dysuria: no Onset: gradual Severity: moderate Alleviating factors: nothing Aggravating factors: drinking too much water Treatments attempted: as above  Depression Screen done today and results listed below:     10/11/2023   11:16 AM 09/11/2023   11:15 AM  Depression screen PHQ 2/9  Decreased Interest 0 0  Down, Depressed, Hopeless 0 0  PHQ - 2 Score 0 0  Altered sleeping 0 0  Tired, decreased energy 0 0  Change in appetite 0 0  Feeling bad or failure about yourself  0 0  Trouble concentrating 0 0  Moving slowly or fidgety/restless 0 0  Suicidal thoughts 0 0  PHQ-9 Score 0 0  Difficult doing work/chores Not difficult at all Not difficult at all      10/11/2023   11:17 AM 09/11/2023   11:15 AM  GAD 7 : Generalized Anxiety Score  Nervous, Anxious, on Edge 0 0  Control/stop worrying 0 0  Worry too much - different things 0 0  Trouble relaxing 0 0  Restless 0 0  Easily annoyed or irritable 0 0  Afraid - awful might happen 0 0  Total GAD 7 Score 0 0  Anxiety Difficulty Not difficult at all Not difficult at all      01/06/2020   12:10 PM 07/14/2020    8:38 AM 12/21/2021    6:33 AM 09/11/2023   11:15 AM 10/11/2023   11:16 AM  Fall Risk  Falls in the past year?  0 0  Was there an injury with Fall?    0 0  Fall Risk Category Calculator    0 0  (RETIRED) Patient Fall Risk Level Low fall risk Low fall risk High fall risk    Patient at Risk for Falls Due to    No Fall Risks No Fall Risks  Fall risk Follow up    Falls evaluation completed Falls evaluation completed    Functional Status Survey: Is the patient deaf or have difficulty hearing?: No Does the patient have difficulty seeing, even when wearing glasses/contacts?: No Does the patient have difficulty concentrating, remembering, or making decisions?: No Does the patient  have difficulty walking or climbing stairs?: No Does the patient have difficulty dressing or bathing?: No Does the patient have difficulty doing errands alone such as visiting a doctor's office or shopping?: No   Past Medical History:  Past Medical History:  Diagnosis Date   Heart murmur    when was a child   Hyperlipidemia    Prostate cancer [facility])     Surgical History:  Past Surgical History:  Procedure Laterality Date   HERNIA REPAIR Right 1993   inguinal hernia   INSERTION OF MESH  12/21/2021   Procedure: INSERTION OF MESH;  Surgeon: Eldred Grego, MD;  Location: [facility] ORS;  Service: General;;   PROSTATE BIOPSY      Medications:  Current Outpatient Medications on File Prior to Visit  Medication Sig   ascorbic acid (VITAMIN C) 500 MG tablet Take 500 mg by mouth daily.   aspirin 81 MG EC tablet Take 81 mg by mouth daily.   cholecalciferol (VITAMIN D3) 25 MCG (1000 UNIT) tablet Take 1,000 Units by mouth daily.   cycloSPORINE (RESTASIS) 0.05 % ophthalmic emulsion 1 drop 2 (two) times daily.   tadalafil (CIALIS) 20 MG tablet Take 20 mg by mouth daily as needed for erectile dysfunction.   tamsulosin  (FLOMAX ) 0.4 MG CAPS capsule Take 0.4 mg by mouth daily.   No current facility-administered medications on file prior to visit.    Allergies:  Allergies  Allergen Reactions   Sulfamethoxazole-Trimethoprim Other (See Comments)   Atorvastatin Other (See Comments)   Ciprofloxacin Other (See Comments)    Weakness of the arms    Social History:  Social History   Socioeconomic History   Marital status: Married    Spouse name: Not on file   Number of children: 2   Years of education: Not on file   Highest education level: Not on file  Occupational History   Not on file  Tobacco Use   Smoking status: Never   Smokeless tobacco: Never  Vaping Use   Vaping status: Never Used  Substance and Sexual Activity   Alcohol use: No   Drug use: Never   Sexual activity: Yes   Other Topics Concern   Not on file  Social History Narrative   Not on file   Social Drivers of Health   Financial Resource Strain: Low Risk  (09/11/2023)   Overall Financial Resource Strain (CARDIA)    Difficulty of Paying Living Expenses: Not hard at all  Food Insecurity: No Food Insecurity (09/11/2023)   Hunger Vital Sign    Worried About Running Out of Food in the Last Year: Never true    Ran Out of Food in the Last Year: Never true  Transportation Needs: No Transportation Needs (09/11/2023)   PRAPARE - Administrator, Civil Service (Medical): No    Lack of Transportation (  Non-Medical): No  Physical Activity: Insufficiently Active (09/11/2023)   Exercise Vital Sign    Days of Exercise per Week: 2 days    Minutes of Exercise per Session: 10 min  Stress: No Stress Concern Present (09/11/2023)   [employer] of Occupational Health - Occupational Stress Questionnaire    Feeling of Stress : Not at all  Social Connections: Socially Integrated (09/11/2023)   Social Connection and Isolation Panel [NHANES]    Frequency of Communication with Friends and Family: More than three times a week    Frequency of Social Gatherings with Friends and Family: More than three times a week    Attends Religious Services: More than 4 times per year    Active Member of [employer] or Organizations: Yes    Attends Banker Meetings: Never    Marital Status: Married  Catering manager Violence: Not At Risk (09/11/2023)   Humiliation, Afraid, Rape, and Kick questionnaire    Fear of Current or Ex-Partner: No    Emotionally Abused: No    Physically Abused: No    Sexually Abused: No   Social History   Tobacco Use  Smoking Status Never  Smokeless Tobacco Never   Social History   Substance and Sexual Activity  Alcohol Use No    Family History:  Family History  Problem Relation Age of Onset   Throat cancer Father    Glaucoma Sister    Breast cancer Neg Hx    Colon cancer Neg  Hx    Pancreatic cancer Neg Hx    Prostate cancer Neg Hx     Past medical history, surgical history, medications, allergies, family history and social history reviewed with patient today and changes made to appropriate areas of the chart.   ROS All other ROS negative except what is listed above and in the HPI.      Objective:     BP 97/60   Pulse 79   Temp 97.9 F (36.6 C) (Oral)   Ht 5' 10.3" (1.786 m)   Wt 171 lb 3.2 oz (77.7 kg)   SpO2 97%   BMI 24.36 kg/m   Wt Readings from Last 3 Encounters:  10/11/23 171 lb 3.2 oz (77.7 kg)  09/11/23 172 lb 3.2 oz (78.1 kg)  12/21/21 175 lb (79.4 kg)    Physical Exam Vitals and nursing note reviewed.  Constitutional:      General: He is awake. He is not in acute distress.    Appearance: He is well-developed and well-groomed. He is not ill-appearing or toxic-appearing.  HENT:     Head: Normocephalic and atraumatic.     Right Ear: Hearing, tympanic membrane, ear canal and external ear normal. No drainage.     Left Ear: Hearing, tympanic membrane, ear canal and external ear normal. No drainage.     Nose: Nose normal.     Mouth/Throat:     Pharynx: Uvula midline.  Eyes:     General: Lids are normal.        Right eye: No discharge.        Left eye: No discharge.     Extraocular Movements: Extraocular movements intact.     Conjunctiva/sclera: Conjunctivae normal.     Pupils: Pupils are equal, round, and reactive to light.     Visual Fields: Right eye visual fields normal and left eye visual fields normal.  Neck:     Thyroid : No thyromegaly.     Vascular: No carotid bruit or JVD.  Trachea: Trachea normal.  Cardiovascular:     Rate and Rhythm: Normal rate and regular rhythm.     Heart sounds: Normal heart sounds, S1 normal and S2 normal. No murmur heard.    No gallop.  Pulmonary:     Effort: Pulmonary effort is normal. No accessory muscle usage or respiratory distress.     Breath sounds: Normal breath sounds.  Abdominal:      General: Bowel sounds are normal.     Palpations: Abdomen is soft. There is no hepatomegaly or splenomegaly.     Tenderness: There is no abdominal tenderness.  Musculoskeletal:        General: Normal range of motion.     Cervical back: Normal range of motion and neck supple.     Lumbar back: No swelling, spasms or tenderness. Normal range of motion. Negative right straight leg raise test and negative left straight leg raise test.     Right lower leg: No edema.     Left lower leg: No edema.  Lymphadenopathy:     Head:     Right side of head: No submental, submandibular, tonsillar, preauricular or posterior auricular adenopathy.     Left side of head: No submental, submandibular, tonsillar, preauricular or posterior auricular adenopathy.     Cervical: No cervical adenopathy.  Skin:    General: Skin is warm and dry.     Capillary Refill: Capillary refill takes less than 2 seconds.     Findings: No rash.  Neurological:     Mental Status: He is alert and oriented to person, place, and time.     Cranial Nerves: Cranial nerves 2-12 are intact.     Motor: Tremor (noted intermittently to right hand) present.     Coordination: Coordination is intact.     Gait: Gait is intact.     Deep Tendon Reflexes: Reflexes are normal and symmetric.     Reflex Scores:      Brachioradialis reflexes are 2+ on the right side and 2+ on the left side.      Patellar reflexes are 2+ on the right side and 2+ on the left side. Psychiatric:        Attention and Perception: Attention normal.        Mood and Affect: Mood normal.        Speech: Speech normal.        Behavior: Behavior normal. Behavior is cooperative.        Thought Content: Thought content normal.        Cognition and Memory: Cognition normal.    Results for orders placed or performed during the hospital encounter of 07/08/23  POC Covid19/Flu A&B Antigen   Collection Time: 07/08/23  4:10 PM  Result Value Ref Range   Influenza A Antigen, POC  Negative Negative   Influenza B Antigen, POC Negative Negative   Covid Antigen, POC Negative Negative      Assessment & Plan:   Problem List Items Addressed This Visit       Cardiovascular and Mediastinum   Atherosclerosis of aorta (HCC)   Noted on past imaging.  Continue statin therapy and Baby ASA daily.  Monitor closely.  Check labs today.      Relevant Medications   rosuvastatin (CRESTOR) 10 MG tablet (Start on 10/12/2023)   Other Relevant Orders   Comprehensive metabolic panel with GFR   Lipid Panel w/o Chol/HDL Ratio     Nervous and Auditory   Benign essential tremor   Chronic,  mother had similar.  He did see neurology in 2017 for this per patient and had full work-up which was reassuring.  No current medications. Monitor closely and consider Propranolol if needed in future, as long as HR allows.      Relevant Orders   CBC with Differential/Platelet   TSH     Genitourinary   Malignant neoplasm of prostate (HCC) - Primary   Ongoing and followed by urology every 6 months.  Has been through treatments and recent PSA stable.  Continue this collaboration.  Recent notes reviewed.      Benign prostate hyperplasia   Chronic, ongoing. Followed by urology.  Has ongoing symptoms after prostate cancer treatment.  Continue current medication regimen and collaboration with urology.        Other   Low back pain   Acute.  Suspect more muscular in nature based on exam and HPI.  No red flags on exam.  Recommend simple treatment at home to include Tylenol  as needed + OTC Lidocaine  patches.  Use heating pad as needed.  If any worsening to alert provider.      Hypercholesterolemia   Chronic, ongoing.  Did not tolerate several statins in past.  Continue Rosuvastatin three times a week.  Labs today.      Relevant Medications   rosuvastatin (CRESTOR) 10 MG tablet (Start on 10/12/2023)   Other Relevant Orders   Comprehensive metabolic panel with GFR   Lipid Panel w/o Chol/HDL Ratio    Other Visit Diagnoses       Need for hepatitis C screening test       Hep C screening today, educated patient.   Relevant Orders   Hepatitis C antibody     Encounter for annual physical exam       Annual physical today with labs and health maintenance reviewed, discussed with patient.        Follow up plan: Return in about 6 months (around 04/12/2024) for HLD.   Discussed aspirin prophylaxis for myocardial infarction prevention and decision was made to continue ASA  LABORATORY TESTING:  Health maintenance labs ordered today as discussed above.   The natural history of prostate cancer and ongoing controversy regarding screening and potential treatment outcomes of prostate cancer has been discussed with the patient. The meaning of a false positive PSA and a false negative PSA has been discussed. He indicates understanding of the limitations of this screening test and wishes to proceed with screening PSA testing -- performs with urology.  IMMUNIZATIONS:   - Tdap: Tetanus vaccination status reviewed: Will obtain next visit - Influenza: Up to date - Pneumovax: Up to date - Prevnar: Up to date - Zostavax vaccine: Refused  SCREENING: - Colonoscopy: Not applicable  Discussed with patient purpose of the colonoscopy is to detect colon cancer at curable precancerous or early stages   - AAA Screening: Not applicable  -Hearing Test: Not applicable  -Spirometry: Not applicable   PATIENT COUNSELING:    Sexuality: Discussed sexually transmitted diseases, partner selection, use of condoms, avoidance of unintended pregnancy  and contraceptive alternatives.   Advised to avoid cigarette smoking.  I discussed with the patient that most people either abstain from alcohol or drink within safe limits (<=14/week and <=4 drinks/occasion for males, <=7/weeks and <= 3 drinks/occasion for females) and that the risk for alcohol disorders and other health effects rises proportionally with the number  of drinks per week and how often a drinker exceeds daily limits.  Discussed cessation/primary prevention of drug  use and availability of treatment for abuse.   Diet: Encouraged to adjust caloric intake to maintain  or achieve ideal body weight, to reduce intake of dietary saturated fat and total fat, to limit sodium intake by avoiding high sodium foods and not adding table salt, and to maintain adequate dietary potassium and calcium preferably from fresh fruits, vegetables, and low-fat dairy products.    Stressed the importance of regular exercise  Injury prevention: Discussed safety belts, safety helmets, smoke detector, smoking near bedding or upholstery.   Dental health: Discussed importance of regular tooth brushing, flossing, and dental visits.   NEXT PREVENTATIVE PHYSICAL DUE IN 1 YEAR. Return in about 6 months (around 04/12/2024) for HLD.         ,

## 2023-10-11 NOTE — Assessment & Plan Note (Signed)
 Chronic, ongoing.  Did not tolerate several statins in past.  Continue Rosuvastatin three times a week.  Labs today.

## 2023-10-11 NOTE — Assessment & Plan Note (Signed)
 Chronic, ongoing. Followed by urology.  Has ongoing symptoms after prostate cancer treatment.  Continue current medication regimen and collaboration with urology.

## 2023-10-11 NOTE — Assessment & Plan Note (Signed)
 Noted on past imaging.  Continue statin therapy and Baby ASA daily.  Monitor closely.  Check labs today.

## 2023-10-12 ENCOUNTER — Telehealth: Payer: Self-pay

## 2023-10-12 ENCOUNTER — Ambulatory Visit: Payer: Self-pay | Admitting: Nurse Practitioner

## 2023-10-12 LAB — CBC WITH DIFFERENTIAL/PLATELET
Basophils Absolute: 0.1 10*3/uL (ref 0.0–0.2)
Basos: 2 %
EOS (ABSOLUTE): 0.2 10*3/uL (ref 0.0–0.4)
Eos: 4 %
Hematocrit: 43.8 % (ref 37.5–51.0)
Hemoglobin: 14.1 g/dL (ref 13.0–17.7)
Immature Grans (Abs): 0 10*3/uL (ref 0.0–0.1)
Immature Granulocytes: 0 %
Lymphocytes Absolute: 1.4 10*3/uL (ref 0.7–3.1)
Lymphs: 35 %
MCH: 31.1 pg (ref 26.6–33.0)
MCHC: 32.2 g/dL (ref 31.5–35.7)
MCV: 97 fL (ref 79–97)
Monocytes Absolute: 0.3 10*3/uL (ref 0.1–0.9)
Monocytes: 7 %
Neutrophils Absolute: 2.1 10*3/uL (ref 1.4–7.0)
Neutrophils: 52 %
Platelets: 244 10*3/uL (ref 150–450)
RBC: 4.53 x10E6/uL (ref 4.14–5.80)
RDW: 13 % (ref 11.6–15.4)
WBC: 4.1 10*3/uL (ref 3.4–10.8)

## 2023-10-12 LAB — LIPID PANEL W/O CHOL/HDL RATIO
Cholesterol, Total: 207 mg/dL — ABNORMAL HIGH (ref 100–199)
HDL: 73 mg/dL (ref >39)
LDL Chol Calc (NIH): 118 mg/dL — ABNORMAL HIGH (ref 0–99)
Triglycerides: 91 mg/dL (ref 0–149)
VLDL Cholesterol Cal: 16 mg/dL (ref 5–40)

## 2023-10-12 LAB — COMPREHENSIVE METABOLIC PANEL WITH GFR
ALT: 32 IU/L (ref 0–44)
AST: 37 IU/L (ref 0–40)
Albumin: 4 g/dL (ref 3.8–4.8)
Alkaline Phosphatase: 96 IU/L (ref 44–121)
BUN/Creatinine Ratio: 13 (ref 10–24)
BUN: 12 mg/dL (ref 8–27)
Bilirubin Total: 0.4 mg/dL (ref 0.0–1.2)
CO2: 21 mmol/L (ref 20–29)
Calcium: 9.2 mg/dL (ref 8.6–10.2)
Chloride: 104 mmol/L (ref 96–106)
Creatinine, Ser: 0.96 mg/dL (ref 0.76–1.27)
Globulin, Total: 2.5 g/dL (ref 1.5–4.5)
Glucose: 105 mg/dL — ABNORMAL HIGH (ref 70–99)
Potassium: 4.2 mmol/L (ref 3.5–5.2)
Sodium: 139 mmol/L (ref 134–144)
Total Protein: 6.5 g/dL (ref 6.0–8.5)
eGFR: 82 mL/min/{1.73_m2} (ref >59)

## 2023-10-12 LAB — HEPATITIS C ANTIBODY: Hep C Virus Ab: NONREACTIVE

## 2023-10-12 LAB — TSH: TSH: 2.38 u[IU]/mL (ref 0.450–4.500)

## 2023-10-12 MED ORDER — EZETIMIBE 10 MG PO TABS: 10.0000 mg | ORAL_TABLET | Freq: Every day | ORAL | 4 refills | Status: DC

## 2023-10-12 NOTE — Telephone Encounter (Signed)
 Copied from CRM [id number]. Topic: Clinical - Lab/Test Results >> Oct 12, 2023  3:03 PM Ethelle Herb L wrote: Reason for CRM: Pt's wife calling back about lab results. Pt relayed lab results to patient and patient agreeable to starting zetia.   Pt requesting zetia be sent in to pharmacy. Wife states if provider wants to do a trial run and wants to send a 30 day supply, pt would need it sent to [facility] - [address] [place] [address], if wanting pt to take it longer than a month, pt would need a 90 day supply to be sent to [facility]

## 2023-10-12 NOTE — Progress Notes (Signed)
 Good afternoon, please let Ronald Fox know his labs have returned and overall these are stable with exception of lipid panel which is showing some elevations.  I know you take Rosuvastatin 3 times a week, would you bee okay with adding on Zetia to this? It may help to lower levels more and is not a statin.  If taking Rosuvastatin and Zetia does not work to lower levels in future, we may need to discuss injectable medications.  Any questions? Keep being amazing!!  Thank you for allowing me to participate in your care.  I appreciate you. Kindest regards, Kionna Brier

## 2023-10-12 NOTE — Progress Notes (Signed)
 Appt scheduled

## 2023-10-12 NOTE — Telephone Encounter (Signed)
 Called and notified patient's wife of instructions.

## 2024-02-20 ENCOUNTER — Ambulatory Visit (INDEPENDENT_AMBULATORY_CARE_PROVIDER_SITE_OTHER)

## 2024-02-20 DIAGNOSIS — Z23 Encounter for immunization: Secondary | ICD-10-CM | POA: Diagnosis not present

## 2024-02-20 NOTE — Progress Notes (Signed)
 Patient is in office today for a nurse visit for Immunization. Patient Injection was given in the  Left deltoid. Patient tolerated injection well.

## 2024-02-25 ENCOUNTER — Ambulatory Visit

## 2024-03-04 ENCOUNTER — Ambulatory Visit

## 2024-04-14 ENCOUNTER — Ambulatory Visit: Admitting: Nurse Practitioner

## 2024-04-17 ENCOUNTER — Ambulatory Visit: Admitting: Emergency Medicine

## 2024-04-17 VITALS — Ht 71.0 in | Wt 170.0 lb

## 2024-04-17 DIAGNOSIS — Z0000 Encounter for general adult medical examination without abnormal findings: Secondary | ICD-10-CM

## 2024-04-17 NOTE — Patient Instructions (Signed)
 Ronald Fox,  Thank you for taking the time for your Medicare Wellness Visit. I appreciate your continued commitment to your health goals. Please review the care plan we discussed, and feel free to reach out if I can assist you further.  Please note that Annual Wellness Visits do not include a physical exam. Some assessments may be limited, especially if the visit was conducted virtually. If needed, we may recommend an in-person follow-up with your provider.  Ongoing Care Seeing your primary care provider every 3 to 6 months helps us  monitor your health and provide consistent, personalized care.   Referrals If a referral was made during today's visit and you haven't received any updates within two weeks, please contact the referred provider directly to check on the status.  Recommended Screenings:  You may get the covid vaccine at your next OV on 05/13/24.  Health Maintenance  Topic Date Due   Zoster (Shingles) Vaccine (1 of 2) [birth year]/1966   Medicare Annual Wellness Visit  08/05/2019   COVID-19 Vaccine (6 - 2025-26 season) 01/14/2024   DTaP/Tdap/Td vaccine (2 - Td or Tdap) 10/06/2024*   Pneumococcal Vaccine for age over 72  Completed   Flu Shot  Completed   Hepatitis C Screening  Completed   Meningitis B Vaccine  Aged Out  *Topic was postponed. The date shown is not the original due date.       04/17/2024    2:06 PM  Advanced Directives  Does Patient Have a Medical Advance Directive? No  Would patient like information on creating a medical advance directive? Yes (MAU/Ambulatory/Procedural Areas - Information given)    Vision: Annual vision screenings are recommended for early detection of glaucoma, cataracts, and diabetic retinopathy. These exams can also reveal signs of chronic conditions such as diabetes and high blood pressure.  Dental: Annual dental screenings help detect early signs of oral cancer, gum disease, and other conditions linked to overall health, including heart  disease and diabetes.  Please see the attached documents for additional preventive care recommendations.

## 2024-04-17 NOTE — Progress Notes (Signed)
 Chief Complaint  Patient presents with   Medicare Wellness     Subjective:   Ronald Fox is a 78 y.o. male who presents for a Medicare Annual Wellness Visit.  Visit info / Clinical Intake: Medicare Wellness Visit Type:: Subsequent Annual Wellness Visit Persons participating in visit and providing information:: patient Medicare Wellness Visit Mode:: Telephone If telephone:: video declined Since this visit was completed virtually, some vitals may be partially provided or unavailable. Missing vitals are due to the limitations of the virtual format.: Documented vitals are patient reported If Telephone or Video please confirm:: I connected with patient using audio/video enable telemedicine. I verified patient identity with two identifiers, discussed telehealth limitations, and patient agreed to proceed. Patient Location:: home Provider Location:: home Interpreter Needed?: No Pre-visit prep was completed: yes AWV questionnaire completed by patient prior to visit?: no Living arrangements:: lives with spouse/significant other Patient's Overall Health Status Rating: very good Typical amount of pain: none Does pain affect daily life?: no Are you currently prescribed opioids?: no  Dietary Habits and Nutritional Risks How many meals a day?: 2 Eats fruit and vegetables daily?: (!) no Most meals are obtained by: preparing own meals In the last 2 weeks, have you had any of the following?: none Diabetic:: no  Functional Status Activities of Daily Living (to include ambulation/medication): Independent Ambulation: Independent with device- listed below Home Assistive Devices/Equipment: Eyeglasses Medication Administration: Independent Home Management (perform basic housework or laundry): Independent Manage your own finances?: yes Primary transportation is: driving Concerns about vision?: no *vision screening is required for WTM* Concerns about hearing?: (!) yes (some hearing loss) Uses  hearing aids?: no Hear whispered voice?: yes  Fall Screening Falls in the past year?: 0 Number of falls in past year: 0 Was there an injury with Fall?: 0 Fall Risk Category Calculator: 0 Patient Fall Risk Level: Low Fall Risk  Fall Risk Patient at Risk for Falls Due to: No Fall Risks Fall risk Follow up: Falls evaluation completed  Home and Transportation Safety: All rugs have non-skid backing?: (!) no All stairs or steps have railings?: yes Grab bars in the bathtub or shower?: yes Have non-skid surface in bathtub or shower?: yes Good home lighting?: yes Regular seat belt use?: yes Hospital stays in the last year:: no  Cognitive Assessment Difficulty concentrating, remembering, or making decisions? : no Will 6CIT or Mini Cog be Completed: yes What year is it?: 0 points What month is it?: 0 points Give patient an address phrase to remember (5 components): [address] [place] About what time is it?: 0 points Count backwards from 20 to 1: 0 points Say the months of the year in reverse: 0 points Repeat the address phrase from earlier: 4 points 6 CIT Score: 4 points  Advance Directives (For Healthcare) Does Patient Have a Medical Advance Directive?: No Would patient like information on creating a medical advance directive?: Yes (MAU/Ambulatory/Procedural Areas - Information given)  Reviewed/Updated  Reviewed/Updated: Reviewed All (Medical, Surgical, Family, Medications, Allergies, Care Teams, Patient Goals)    Allergies (verified) Sulfamethoxazole-trimethoprim, Atorvastatin, and Ciprofloxacin   Current Medications (verified) Outpatient Encounter Medications as of 04/17/2024  Medication Sig   ascorbic acid (VITAMIN C) 500 MG tablet Take 500 mg by mouth daily.   aspirin 81 MG EC tablet Take 81 mg by mouth daily.   cholecalciferol (VITAMIN D3) 25 MCG (1000 UNIT) tablet Take 1,000 Units by mouth daily.   cycloSPORINE (RESTASIS) 0.05 % ophthalmic emulsion 1 drop 2 (two)  times daily.   ezetimibe  (ZETIA ) 10 MG tablet Take 1 tablet (10 mg total) by mouth daily.   rosuvastatin  (CRESTOR ) 10 MG tablet Take 1 tablet (10 mg total) by mouth 3 (three) times a week.   tadalafil (CIALIS) 20 MG tablet Take 20 mg by mouth daily as needed for erectile dysfunction.   tamsulosin  (FLOMAX ) 0.4 MG CAPS capsule Take 0.4 mg by mouth daily.   No facility-administered encounter medications on file as of 04/17/2024.    History: Past Medical History:  Diagnosis Date   Heart murmur    when was a child   Hyperlipidemia    Prostate cancer [facility])    Past Surgical History:  Procedure Laterality Date   HERNIA REPAIR Right 1993   inguinal hernia   INSERTION OF MESH  12/21/2021   Procedure: INSERTION OF MESH;  Surgeon: Rodolph Romano, MD;  Location: [facility] ORS;  Service: General;;   PROSTATE BIOPSY     Family History  Problem Relation Age of Onset   Throat cancer Father    Glaucoma Sister    Breast cancer Neg Hx    Colon cancer Neg Hx    Pancreatic cancer Neg Hx    Prostate cancer Neg Hx    Social History   Occupational History   Occupation: retired  Tobacco Use   Smoking status: Never   Smokeless tobacco: Never  Vaping Use   Vaping status: Never Used  Substance and Sexual Activity   Alcohol use: No   Drug use: Never   Sexual activity: Yes   Tobacco Counseling Counseling given: Not Answered  SDOH Screenings   Food Insecurity: No Food Insecurity (04/17/2024)  Housing: Low Risk  (04/17/2024)  Transportation Needs: No Transportation Needs (04/17/2024)  Utilities: Not At Risk (04/17/2024)  Alcohol Screen: Low Risk  (09/11/2023)  Depression (PHQ2-9): Low Risk  (04/17/2024)  Financial Resource Strain: Low Risk  (09/11/2023)  Physical Activity: Inactive (04/17/2024)  Social Connections: Socially Integrated (04/17/2024)  Stress: No Stress Concern Present (04/17/2024)  Tobacco Use: Low Risk  (04/17/2024)  Health Literacy: Adequate Health Literacy (04/17/2024)   See  flowsheets for full screening details  Depression Screen PHQ 2 & 9 Depression Scale- Over the past 2 weeks, how often have you been bothered by any of the following problems? Little interest or pleasure in doing things: 0 Feeling down, depressed, or hopeless (PHQ Adolescent also includes...irritable): 0 PHQ-2 Total Score: 0 Trouble falling or staying asleep, or sleeping too much: 0 Feeling tired or having little energy: 0 Poor appetite or overeating (PHQ Adolescent also includes...weight loss): 0 Feeling bad about yourself - or that you are a failure or have let yourself or your family down: 0 Trouble concentrating on things, such as reading the newspaper or watching television (PHQ Adolescent also includes...like school work): 0 Moving or speaking so slowly that other people could have noticed. Or the opposite - being so fidgety or restless that you have been moving around a lot more than usual: 0 Thoughts that you would be better off dead, or of hurting yourself in some way: 0 PHQ-9 Total Score: 0 If you checked off any problems, how difficult have these problems made it for you to do your work, take care of things at home, or get along with other people?: Not difficult at all  Depression Treatment Depression Interventions/Treatment : EYV7-0 Score <4 Follow-up Not Indicated     Goals Addressed               This  Visit's Progress     Increase physical activity (pt-stated)               Objective:    Today's Vitals   04/17/24 1401  Weight: 170 lb (77.1 kg)  Height: 5' 11 (1.803 m)   Body mass index is 23.71 kg/m.  Hearing/Vision screen Hearing Screening - Comments:: Some hearing loss Vision Screening - Comments:: UTD @ [facility] Immunizations and Health Maintenance Health Maintenance  Topic Date Due   Zoster Vaccines- Shingrix (1 of 2) 12/17/1966   COVID-19 Vaccine (6 - 2025-26 season) 01/14/2024   DTaP/Tdap/Td (2 - Td or Tdap) 10/06/2024  (Originally 07/04/2020)   Medicare Annual Wellness (AWV)  04/17/2025   Pneumococcal Vaccine: 50+ Years  Completed   Influenza Vaccine  Completed   Hepatitis C Screening  Completed   Meningococcal B Vaccine  Aged Out        Assessment/Plan:  This is a routine wellness examination for Taym.  Patient Care Team: Valerio Melanie DASEN, NP as PCP - General (Nurse Practitioner) Nicholaus Tanda LITTIE DOUGLAS, MD as Attending Physician (Urology) Pa, [facility])  I have personally reviewed and noted the following in the patient's chart:   Medical and social history Use of alcohol, tobacco or illicit drugs  Current medications and supplements including opioid prescriptions. Functional ability and status Nutritional status Physical activity Advanced directives List of other physicians Hospitalizations, surgeries, and ER visits in previous 12 months Vitals Screenings to include cognitive, depression, and falls Referrals and appointments  No orders of the defined types were placed in this encounter.  In addition, I have reviewed and discussed with patient certain preventive protocols, quality metrics, and best practice recommendations. A written personalized care plan for preventive services as well as general preventive health recommendations were provided to patient.   Vina Ned, CMA   04/17/2024   Return in 53 weeks (on 04/23/2025) for Medicare Annual Wellness Visit.  After Visit Summary: (Mail) Due to this being a telephonic visit, the after visit summary with patients personalized plan was offered to patient via mail   Nurse Notes:  6 CIT Score - 4 Patient would like to get Covid vaccine at [facility] on 05/13/24 Declined Shingles and Tdap vaccines

## 2024-05-10 NOTE — Patient Instructions (Signed)
 Be Involved in Caring For Your Health:  Taking Medications When medications are taken as directed, they can greatly improve your health. But if they are not taken as prescribed, they may not work. In some cases, not taking them correctly can be harmful. To help ensure your treatment remains effective and safe, understand your medications and how to take them. Bring your medications to each visit for review by your provider.  Your lab results, notes, and after visit summary will be available on My Chart. We strongly encourage you to use this feature. If lab results are abnormal the clinic will contact you with the appropriate steps. If the clinic does not contact you assume the results are satisfactory. You can always view your results on My Chart. If you have questions regarding your health or results, please contact the clinic during office hours. You can also ask questions on My Chart.  We at [facility] are grateful that you chose [place] to provide your care. We strive to provide evidence-based and compassionate care and are always looking for feedback. If you get a survey from the clinic please complete this so we can hear your opinions.  Heart-Healthy Eating Plan Many factors influence your heart health, including eating and exercise habits. Heart health is also called coronary health. Coronary risk increases with abnormal blood fat (lipid) levels. A heart-healthy eating plan includes limiting unhealthy fats, increasing healthy fats, limiting salt (sodium) intake, and making other diet and lifestyle changes. What is my plan? Your health care provider may recommend that: You limit your fat intake to _________% or less of your total calories each day. You limit your saturated fat intake to _________% or less of your total calories each day. You limit the amount of cholesterol in your diet to less than _________ mg per day. You limit the amount of sodium in your diet to less than _________  mg per day. What are tips for following this plan? Cooking Cook foods using methods other than frying. Baking, boiling, grilling, and broiling are all good options. Other ways to reduce fat include: Removing the skin from poultry. Removing all visible fats from meats. Steaming vegetables in water or broth. Meal planning  At meals, imagine dividing your plate into fourths: Fill one-half of your plate with vegetables and green salads. Fill one-fourth of your plate with whole grains. Fill one-fourth of your plate with lean protein foods. Eat 2-4 cups of vegetables per day. One cup of vegetables equals 1 cup (91 g) broccoli or cauliflower florets, 2 medium carrots, 1 large bell pepper, 1 large sweet potato, 1 large tomato, 1 medium white potato, 2 cups (150 g) raw leafy greens. Eat 1-2 cups of fruit per day. One cup of fruit equals 1 small apple, 1 large banana, 1 cup (237 g) mixed fruit, 1 large orange,  cup (82 g) dried fruit, 1 cup (240 mL) 100% fruit juice. Eat more foods that contain soluble fiber. Examples include apples, broccoli, carrots, beans, peas, and barley. Aim to get 25-30 g of fiber per day. Increase your consumption of legumes, nuts, and seeds to 4-5 servings per week. One serving of dried beans or legumes equals  cup (90 g) cooked, 1 serving of nuts is  oz (12 almonds, 24 pistachios, or 7 walnut halves), and 1 serving of seeds equals  oz (8 g). Fats Choose healthy fats more often. Choose monounsaturated and polyunsaturated fats, such as olive and canola oils, avocado oil, flaxseeds, walnuts, almonds, and seeds. Eat  more omega-3 fats. Choose salmon, mackerel, sardines, tuna, flaxseed oil, and ground flaxseeds. Aim to eat fish at least 2 times each week. Check food labels carefully to identify foods with trans fats or high amounts of saturated fat. Limit saturated fats. These are found in animal products, such as meats, butter, and cream. Plant sources of saturated fats  include palm oil, palm kernel oil, and coconut oil. Avoid foods with partially hydrogenated oils in them. These contain trans fats. Examples are stick margarine, some tub margarines, cookies, crackers, and other baked goods. Avoid fried foods. General information Eat more home-cooked food and less restaurant, buffet, and fast food. Limit or avoid alcohol. Limit foods that are high in added sugar and simple starches such as foods made using white refined flour (white breads, pastries, sweets). Lose weight if you are overweight. Losing just 5-10% of your body weight can help your overall health and prevent diseases such as diabetes and heart disease. Monitor your sodium intake, especially if you have high blood pressure. Talk with your health care provider about your sodium intake. Try to incorporate more vegetarian meals weekly. What foods should I eat? Fruits All fresh, canned (in natural juice), or frozen fruits. Vegetables Fresh or frozen vegetables (raw, steamed, roasted, or grilled). Green salads. Grains Most grains. Choose whole wheat and whole grains most of the time. Rice and pasta, including brown rice and pastas made with whole wheat. Meats and other proteins Lean, well-trimmed beef, veal, pork, and lamb. Chicken and [place] without skin. All fish and shellfish. Wild duck, rabbit, pheasant, and venison. Egg whites or low-cholesterol egg substitutes. Dried beans, peas, lentils, and tofu. Seeds and most nuts. Dairy Low-fat or nonfat cheeses, including ricotta and mozzarella. Skim or 1% milk (liquid, powdered, or evaporated). Buttermilk made with low-fat milk. Nonfat or low-fat yogurt. Fats and oils Non-hydrogenated (trans-free) margarines. Vegetable oils, including soybean, sesame, sunflower, olive, avocado, peanut, safflower, corn, canola, and cottonseed. Salad dressings or mayonnaise made with a vegetable oil. Beverages Water (mineral or sparkling). Coffee and tea. Unsweetened ice  tea. Diet beverages. Sweets and desserts Sherbet, gelatin, and fruit ice. Small amounts of dark chocolate. Limit all sweets and desserts. Seasonings and condiments All seasonings and condiments. The items listed above may not be a complete list of foods and beverages you can eat. Contact a dietitian for more options. What foods should I avoid? Fruits Canned fruit in heavy syrup. Fruit in cream or butter sauce. Fried fruit. Limit coconut. Vegetables Vegetables cooked in cheese, cream, or butter sauce. Fried vegetables. Grains Breads made with saturated or trans fats, oils, or whole milk. Croissants. Sweet rolls. Donuts. High-fat crackers, such as cheese crackers and chips. Meats and other proteins Fatty meats, such as hot dogs, ribs, sausage, bacon, rib-eye roast or steak. High-fat deli meats, such as salami and bologna. Caviar. Domestic duck and goose. Organ meats, such as liver. Dairy Cream, sour cream, cream cheese, and creamed cottage cheese. Whole-milk cheeses. Whole or 2% milk (liquid, evaporated, or condensed). Whole buttermilk. Cream sauce or high-fat cheese sauce. Whole-milk yogurt. Fats and oils Meat fat, or shortening. Cocoa butter, hydrogenated oils, palm oil, coconut oil, palm kernel oil. Solid fats and shortenings, including bacon fat, salt pork, lard, and butter. Nondairy cream substitutes. Salad dressings with cheese or sour cream. Beverages Regular sodas and any drinks with added sugar. Sweets and desserts Frosting. Pudding. Cookies. Cakes. Pies. Milk chocolate or white chocolate. Buttered syrups. Full-fat ice cream or ice cream drinks. The items listed above may  not be a complete list of foods and beverages to avoid. Contact a dietitian for more information. Summary Heart-healthy meal planning includes limiting unhealthy fats, increasing healthy fats, limiting salt (sodium) intake and making other diet and lifestyle changes. Lose weight if you are overweight. Losing just  5-10% of your body weight can help your overall health and prevent diseases such as diabetes and heart disease. Focus on eating a balance of foods, including fruits and vegetables, low-fat or nonfat dairy, lean protein, nuts and legumes, whole grains, and heart-healthy oils and fats. This information is not intended to replace advice given to you by your health care provider. Make sure you discuss any questions you have with your health care provider. Document Revised: 06/06/2021 Document Reviewed: 06/06/2021 [employer]vier Patient Education  2024 [employer].

## 2024-05-13 ENCOUNTER — Ambulatory Visit: Admitting: Nurse Practitioner

## 2024-05-13 ENCOUNTER — Encounter: Payer: Self-pay | Admitting: Nurse Practitioner

## 2024-05-13 VITALS — BP 110/69 | HR 90 | Temp 97.5°F | Resp 17 | Ht 70.98 in | Wt 173.4 lb

## 2024-05-13 DIAGNOSIS — C61 Malignant neoplasm of prostate: Secondary | ICD-10-CM

## 2024-05-13 DIAGNOSIS — G250 Essential tremor: Secondary | ICD-10-CM

## 2024-05-13 DIAGNOSIS — Z23 Encounter for immunization: Secondary | ICD-10-CM

## 2024-05-13 DIAGNOSIS — H903 Sensorineural hearing loss, bilateral: Secondary | ICD-10-CM

## 2024-05-13 DIAGNOSIS — E7800 Pure hypercholesterolemia, unspecified: Secondary | ICD-10-CM | POA: Diagnosis not present

## 2024-05-13 MED ORDER — EZETIMIBE 10 MG PO TABS: 10.0000 mg | ORAL_TABLET | Freq: Every day | ORAL | 4 refills | Status: AC

## 2024-05-13 NOTE — Progress Notes (Signed)
 "  BP 110/69 (BP Location: Left Arm, Patient Position: Sitting, Cuff Size: Normal)   Pulse 90   Temp (!) 97.5 F (36.4 C) (Oral)   Resp 17   Ht 5' 10.98 (1.803 m)   Wt 173 lb 6.4 oz (78.7 kg)   BMI 24.20 kg/m    Subjective:    Patient ID: Ronald Fox, male    DOB: 08/08/1945, 78 y.o.   MRN: [id number]  HPI: Ronald Fox is a 78 y.o. male  Chief Complaint  Patient presents with   Follow-up    Follow up appointment, no major concerns   HYPERLIPIDEMIA Taking Rosuvastatin  3 times weekly and Zetia  daily.  Other statins caused side effects. Hyperlipidemia status: good compliance Satisfied with current treatment?  yes Side effects:  no Medication compliance: good compliance Supplements: none Aspirin:  yes The 10-year ASCVD risk score (Arnett DK, et al., 2019) is: 10%   Values used to calculate the score:     Age: 59 years     Clinically relevant sex: Male     Is Non-Hispanic African American: Yes     Diabetic: No     Tobacco smoker: No     Systolic Blood Pressure: 110 mmHg     Is BP treated: No     HDL Cholesterol: 73 mg/dL     Total Cholesterol: 207 mg/dL Chest pain:  no Coronary artery disease:  no Family history CAD:  no Family history early CAD:  no   PROSTATE CANCER To visit with urology every 6 months, last 02/15/23 -- missed May visit this year.  Goes again in January.  Has been through treatments. Takes Flomax  nightly. BPH status: stable Satisfied with current treatment?: yes Medication side effects: no Medication compliance: good compliance Duration: chronic Nocturia: 2-4x per night Urinary frequency:yes Incomplete voiding: yes Urgency: yes Weak urinary stream: yes Straining to start stream: yes Dysuria: no Onset: gradual Severity: moderate Alleviating factors: nothing Aggravating factors: drinking too much water Treatments attempted: as above IPSS Questionnaire (AUA-7): 20 AUA Over the past month   1)  How often have you had a sensation of not  emptying your bladder completely after you finish urinating?  3 - About half the time  2)  How often have you had to urinate again less than two hours after you finished urinating? 3 - About half the time  3)  How often have you found you stopped and started again several times when you urinated?  3 - About half the time  4) How difficult have you found it to postpone urination?  2 - Less than half the time  5) How often have you had a weak urinary stream?  3 - About half the time  6) How often have you had to push or strain to begin urination?  3 - About half the time  7) How many times did you most typically get up to urinate from the time you went to bed until the time you got up in the morning?  3 - 3 times  Total score:  0-7 mildly symptomatic   8-19 moderately symptomatic   20-35 severely symptomatic     Corrected these, as accidentally hit 3's thinking these were negatives.    05/13/2024    2:55 PM 05/13/2024    2:33 PM 04/17/2024    2:13 PM 10/11/2023   11:16 AM 09/11/2023   11:15 AM  Depression screen PHQ 2/9  Decreased Interest 0 3 0 0 0  Down, Depressed, Hopeless 0 3 0 0 0  PHQ - 2 Score 0 6 0 0 0  Altered sleeping 0 3 0 0 0  Tired, decreased energy 0 3 0 0 0  Change in appetite 0 3 0 0 0  Feeling bad or failure about yourself  0 3 0 0 0  Trouble concentrating 0 3 0 0 0  Moving slowly or fidgety/restless 0 3 0 0 0  Suicidal thoughts 0 3 0 0 0  PHQ-9 Score 0 27 0 0  0   Difficult doing work/chores Not difficult at all  Not difficult at all Not difficult at all Not difficult at all     Data saved with a previous flowsheet row definition       05/13/2024    2:56 PM 05/13/2024    2:33 PM 10/11/2023   11:17 AM 09/11/2023   11:15 AM  GAD 7 : Generalized Anxiety Score  Nervous, Anxious, on Edge 0 3 0 0  Control/stop worrying 0 3 0 0  Worry too much - different things 0 3 0 0  Trouble relaxing 0 3 0 0  Restless 0 3 0 0  Easily annoyed or irritable 0 3 0 0  Afraid -  awful might happen 0 3 0 0  Total GAD 7 Score 0 21 0 0  Anxiety Difficulty Not difficult at all  Not difficult at all Not difficult at all   Relevant past medical, surgical, family and social history reviewed and updated as indicated. Interim medical history since our last visit reviewed. Allergies and medications reviewed and updated.  Review of Systems  Constitutional:  Negative for activity change, appetite change, diaphoresis, fatigue and fever.  Respiratory:  Negative for cough, chest tightness, shortness of breath and wheezing.   Cardiovascular:  Negative for chest pain, palpitations and leg swelling.  Gastrointestinal: Negative.   Neurological: Negative.   Psychiatric/Behavioral: Negative.     Per HPI unless specifically indicated above     Objective:    BP 110/69 (BP Location: Left Arm, Patient Position: Sitting, Cuff Size: Normal)   Pulse 90   Temp (!) 97.5 F (36.4 C) (Oral)   Resp 17   Ht 5' 10.98 (1.803 m)   Wt 173 lb 6.4 oz (78.7 kg)   BMI 24.20 kg/m   Wt Readings from Last 3 Encounters:  05/13/24 173 lb 6.4 oz (78.7 kg)  04/17/24 170 lb (77.1 kg)  10/11/23 171 lb 3.2 oz (77.7 kg)    Physical Exam Vitals and nursing note reviewed.  Constitutional:      General: He is awake. He is not in acute distress.    Appearance: He is well-developed and well-groomed. He is not ill-appearing or toxic-appearing.  HENT:     Head: Normocephalic.     Right Ear: Hearing and external ear normal.     Left Ear: Hearing and external ear normal.  Eyes:     General: Lids are normal.     Extraocular Movements: Extraocular movements intact.     Conjunctiva/sclera: Conjunctivae normal.  Neck:     Thyroid : No thyromegaly.     Vascular: No carotid bruit.  Cardiovascular:     Rate and Rhythm: Normal rate and regular rhythm.     Heart sounds: Normal heart sounds. No murmur heard.    No gallop.  Pulmonary:     Effort: No accessory muscle usage or respiratory distress.     Breath  sounds: Normal breath sounds. No decreased breath sounds,  wheezing or rales.  Abdominal:     General: Bowel sounds are normal. There is no distension.     Palpations: Abdomen is soft.     Tenderness: There is no abdominal tenderness.  Musculoskeletal:     Cervical back: Full passive range of motion without pain.     Right lower leg: No edema.     Left lower leg: No edema.  Lymphadenopathy:     Cervical: No cervical adenopathy.  Skin:    General: Skin is warm.     Capillary Refill: Capillary refill takes less than 2 seconds.  Neurological:     Mental Status: He is alert and oriented to person, place, and time.     Deep Tendon Reflexes: Reflexes are normal and symmetric.     Reflex Scores:      Brachioradialis reflexes are 2+ on the right side and 2+ on the left side.      Patellar reflexes are 2+ on the right side and 2+ on the left side. Psychiatric:        Attention and Perception: Attention normal.        Mood and Affect: Mood normal.        Speech: Speech normal.        Behavior: Behavior normal. Behavior is cooperative.        Thought Content: Thought content normal.    Results for orders placed or performed in visit on 10/11/23  CBC with Differential/Platelet   Collection Time: 10/11/23 11:26 AM  Result Value Ref Range   WBC 4.1 3.4 - 10.8 x10E3/uL   RBC 4.53 4.14 - 5.80 x10E6/uL   Hemoglobin 14.1 13.0 - 17.7 g/dL   Hematocrit 56.1 62.4 - 51.0 %   MCV 97 79 - 97 fL   MCH 31.1 26.6 - 33.0 pg   MCHC 32.2 31.5 - 35.7 g/dL   RDW 86.9 88.3 - 84.5 %   Platelets 244 150 - 450 x10E3/uL   Neutrophils 52 Not Estab. %   Lymphs 35 Not Estab. %   Monocytes 7 Not Estab. %   Eos 4 Not Estab. %   Basos 2 Not Estab. %   Neutrophils Absolute 2.1 1.4 - 7.0 x10E3/uL   Lymphocytes Absolute 1.4 0.7 - 3.1 x10E3/uL   Monocytes Absolute 0.3 0.1 - 0.9 x10E3/uL   EOS (ABSOLUTE) 0.2 0.0 - 0.4 x10E3/uL   Basophils Absolute 0.1 0.0 - 0.2 x10E3/uL   Immature Granulocytes 0 Not Estab. %    Immature Grans (Abs) 0.0 0.0 - 0.1 x10E3/uL  Comprehensive metabolic panel with GFR   Collection Time: 10/11/23 11:26 AM  Result Value Ref Range   Glucose 105 (H) 70 - 99 mg/dL   BUN 12 8 - 27 mg/dL   Creatinine, Ser 9.03 0.76 - 1.27 mg/dL   eGFR 82 >40 fO/fpw/8.26   BUN/Creatinine Ratio 13 10 - 24   Sodium 139 134 - 144 mmol/L   Potassium 4.2 3.5 - 5.2 mmol/L   Chloride 104 96 - 106 mmol/L   CO2 21 20 - 29 mmol/L   Calcium  9.2 8.6 - 10.2 mg/dL   Total Protein 6.5 6.0 - 8.5 g/dL   Albumin 4.0 3.8 - 4.8 g/dL   Globulin, Total 2.5 1.5 - 4.5 g/dL   Bilirubin Total 0.4 0.0 - 1.2 mg/dL   Alkaline Phosphatase 96 44 - 121 IU/L   AST 37 0 - 40 IU/L   ALT 32 0 - 44 IU/L  Lipid Panel w/o Chol/HDL Ratio  Collection Time: 10/11/23 11:26 AM  Result Value Ref Range   Cholesterol, Total 207 (H) 100 - 199 mg/dL   Triglycerides 91 0 - 149 mg/dL   HDL 73 >60 mg/dL   VLDL Cholesterol Cal 16 5 - 40 mg/dL   LDL Chol Calc (NIH) 881 (H) 0 - 99 mg/dL  TSH   Collection Time: 10/11/23 11:26 AM  Result Value Ref Range   TSH 2.380 0.450 - 4.500 uIU/mL  Hepatitis C antibody   Collection Time: 10/11/23 11:26 AM  Result Value Ref Range   Hep C Virus Ab Non Reactive Non Reactive      Assessment & Plan:   Problem List Items Addressed This Visit       Genitourinary   Malignant neoplasm of prostate (HCC) - Primary   Ongoing and followed by urology every 6 months.  Has been through treatments and recent PSA stable.  Continue this collaboration.  Recent notes reviewed. He will discuss with them his ongoing symptoms.        Other   Hypercholesterolemia   Chronic, ongoing.  Did not tolerate several statins in past.  Continue Rosuvastatin  three times a week and Zetia  daily.  Labs today.      Relevant Medications   ezetimibe  (ZETIA ) 10 MG tablet   Other Relevant Orders   Comprehensive metabolic panel with GFR   Lipid Panel w/o Chol/HDL Ratio   Other Visit Diagnoses       COVID-19 vaccine  administered       Covid vaccine in office today, educated on this.   Relevant Orders   Pfizer Comirnaty Covid-19 Vaccine 33yrs & older        Follow up plan: Return in about 6 months (around 11/11/2024) for Annual Physical -- after 10/10/24.      "

## 2024-05-13 NOTE — Assessment & Plan Note (Signed)
 Ongoing and followed by urology every 6 months.  Has been through treatments and recent PSA stable.  Continue this collaboration.  Recent notes reviewed. He will discuss with them his ongoing symptoms.

## 2024-05-13 NOTE — Assessment & Plan Note (Signed)
 Chronic, ongoing.  Did not tolerate several statins in past.  Continue Rosuvastatin  three times a week and Zetia  daily.  Labs today.

## 2024-05-14 ENCOUNTER — Ambulatory Visit: Payer: Self-pay | Admitting: Nurse Practitioner

## 2024-05-14 LAB — COMPREHENSIVE METABOLIC PANEL WITH GFR
ALT: 27 IU/L (ref 0–44)
AST: 25 IU/L (ref 0–40)
Albumin: 3.8 g/dL (ref 3.8–4.8)
Alkaline Phosphatase: 93 IU/L (ref 47–123)
BUN/Creatinine Ratio: 17 (ref 10–24)
BUN: 17 mg/dL (ref 8–27)
Bilirubin Total: 0.4 mg/dL (ref 0.0–1.2)
CO2: 24 mmol/L (ref 20–29)
Calcium: 9.1 mg/dL (ref 8.6–10.2)
Chloride: 104 mmol/L (ref 96–106)
Creatinine, Ser: 1.03 mg/dL (ref 0.76–1.27)
Globulin, Total: 2.7 g/dL (ref 1.5–4.5)
Glucose: 137 mg/dL — ABNORMAL HIGH (ref 70–99)
Potassium: 3.9 mmol/L (ref 3.5–5.2)
Sodium: 141 mmol/L (ref 134–144)
Total Protein: 6.5 g/dL (ref 6.0–8.5)
eGFR: 75 mL/min/1.73

## 2024-05-14 LAB — LIPID PANEL W/O CHOL/HDL RATIO
Cholesterol, Total: 227 mg/dL — ABNORMAL HIGH (ref 100–199)
HDL: 65 mg/dL
LDL Chol Calc (NIH): 123 mg/dL — ABNORMAL HIGH (ref 0–99)
Triglycerides: 225 mg/dL — ABNORMAL HIGH (ref 0–149)
VLDL Cholesterol Cal: 39 mg/dL (ref 5–40)

## 2024-05-14 NOTE — Progress Notes (Signed)
 Good morning, please let Ronald Fox know his labs have returned and overall are stable with exception of ongoing elevation in lipid panel levels. Continue Rosuvastatin  as ordered and take Zetia  with this. If continue to have elevations in future we could discuss stopping current medications and using an injection instead to lower lipid panel levels. Kidney and liver function are normal. Glucose, sugar, is elevated this check. Did you eat before this visit? If so that would explain elevation. Let me know. Any questions? Keep being amazing!!  Thank you for allowing me to participate in your care.  I appreciate you. Kindest regards, Brylea Pita

## 2024-11-10 ENCOUNTER — Encounter: Admitting: Nurse Practitioner

## 2025-04-23 ENCOUNTER — Ambulatory Visit
